# Patient Record
Sex: Female | Born: 1970
Health system: Southern US, Community
[De-identification: ages and names within clinical notes are randomized; demographics above are authoritative.]

## PROBLEM LIST (undated history)

## (undated) DIAGNOSIS — G5602 Carpal tunnel syndrome, left upper limb: Secondary | ICD-10-CM

## (undated) DIAGNOSIS — J302 Other seasonal allergic rhinitis: Secondary | ICD-10-CM

## (undated) DIAGNOSIS — M26609 Unspecified temporomandibular joint disorder, unspecified side: Secondary | ICD-10-CM

## (undated) HISTORY — PX: OTHER SURGICAL HISTORY: SHX169

---

## 2000-06-29 ENCOUNTER — Other Ambulatory Visit: Admission: RE | Admit: 2000-06-29 | Discharge: 2000-06-29 | Payer: Self-pay | Admitting: Family Medicine

## 2009-03-12 ENCOUNTER — Encounter: Admission: RE | Admit: 2009-03-12 | Discharge: 2009-03-12 | Payer: Self-pay | Admitting: Obstetrics and Gynecology

## 2009-05-04 ENCOUNTER — Ambulatory Visit (HOSPITAL_COMMUNITY): Admission: RE | Admit: 2009-05-04 | Discharge: 2009-05-04 | Payer: Self-pay | Admitting: Obstetrics and Gynecology

## 2009-05-19 ENCOUNTER — Ambulatory Visit (HOSPITAL_COMMUNITY): Admission: RE | Admit: 2009-05-19 | Discharge: 2009-05-19 | Payer: Self-pay | Admitting: Obstetrics and Gynecology

## 2009-06-26 ENCOUNTER — Ambulatory Visit: Payer: Self-pay | Admitting: Obstetrics and Gynecology

## 2009-06-26 ENCOUNTER — Inpatient Hospital Stay (HOSPITAL_COMMUNITY): Admission: AD | Admit: 2009-06-26 | Discharge: 2009-06-27 | Payer: Self-pay | Admitting: Obstetrics and Gynecology

## 2009-06-27 ENCOUNTER — Ambulatory Visit: Payer: Self-pay | Admitting: Vascular Surgery

## 2009-06-27 ENCOUNTER — Ambulatory Visit (HOSPITAL_COMMUNITY): Admission: RE | Admit: 2009-06-27 | Discharge: 2009-06-27 | Payer: Self-pay | Admitting: Obstetrics & Gynecology

## 2009-06-27 ENCOUNTER — Encounter (INDEPENDENT_AMBULATORY_CARE_PROVIDER_SITE_OTHER): Payer: Self-pay | Admitting: Obstetrics & Gynecology

## 2009-06-29 ENCOUNTER — Ambulatory Visit: Payer: Self-pay | Admitting: Oncology

## 2009-07-01 LAB — CMP (CANCER CENTER ONLY)
ALT(SGPT): 32 U/L (ref 10–47)
AST: 32 U/L (ref 11–38)
Albumin: 2.9 g/dL — ABNORMAL LOW (ref 3.3–5.5)
Alkaline Phosphatase: 54 U/L (ref 26–84)
BUN, Bld: 7 mg/dL (ref 7–22)
CO2: 22 mEq/L (ref 18–33)
Chloride: 101 mEq/L (ref 98–108)
Creat: 0.5 mg/dl — ABNORMAL LOW (ref 0.6–1.2)
Glucose, Bld: 117 mg/dL (ref 73–118)
Potassium: 3.7 mEq/L (ref 3.3–4.7)
Total Bilirubin: 0.6 mg/dl (ref 0.20–1.60)

## 2009-07-01 LAB — CBC WITH DIFFERENTIAL (CANCER CENTER ONLY)
BASO#: 0.1 10*3/uL (ref 0.0–0.2)
BASO%: 0.7 % (ref 0.0–2.0)
MCHC: 34.2 g/dL (ref 32.0–36.0)
WBC: 9.3 10*3/uL (ref 3.9–10.0)

## 2009-07-06 LAB — HYPERCOAGULABLE PANEL, COMPREHENSIVE
Anticardiolipin IgM: 2 MPL U/mL (ref <11)
Beta-2-Glycoprotein I IgA: 2 A Units (ref <20)
Beta-2-Glycoprotein I IgM: 3 M Units (ref <20)
DRVVT: 38 secs (ref 36.2–44.3)
PTT Lupus Anticoagulant: 40.3 secs (ref 30.0–45.6)
Protein C Activity: 173 % — ABNORMAL HIGH (ref 75–133)

## 2009-07-27 ENCOUNTER — Ambulatory Visit: Payer: Self-pay | Admitting: Oncology

## 2009-07-29 LAB — BASIC METABOLIC PANEL - CANCER CENTER ONLY
BUN, Bld: 6 mg/dL — ABNORMAL LOW (ref 7–22)
Chloride: 102 mEq/L (ref 98–108)
Glucose, Bld: 106 mg/dL (ref 73–118)
Potassium: 3.8 mEq/L (ref 3.3–4.7)
Sodium: 132 mEq/L (ref 128–145)

## 2009-07-29 LAB — CBC WITH DIFFERENTIAL/PLATELET
BASO%: 0.3 % (ref 0.0–2.0)
Basophils Absolute: 0 10*3/uL (ref 0.0–0.1)
EOS%: 0.3 % (ref 0.0–7.0)
Eosinophils Absolute: 0 10*3/uL (ref 0.0–0.5)
HCT: 35.7 % (ref 34.8–46.6)
HGB: 12.4 g/dL (ref 11.6–15.9)
LYMPH%: 19.2 % (ref 14.0–49.7)
MCH: 33.8 pg (ref 25.1–34.0)
MCHC: 34.8 g/dL (ref 31.5–36.0)
MONO#: 0.5 10*3/uL (ref 0.1–0.9)
MONO%: 4.4 % (ref 0.0–14.0)
NEUT#: 7.8 10*3/uL — ABNORMAL HIGH (ref 1.5–6.5)
NEUT%: 75.8 % (ref 38.4–76.8)
RDW: 12.7 % (ref 11.2–14.5)

## 2009-07-30 LAB — HEPARIN ANTI-XA: Heparin LMW: 0.34 IU/mL

## 2009-09-02 ENCOUNTER — Ambulatory Visit: Payer: Self-pay | Admitting: Oncology

## 2009-09-08 LAB — CBC WITH DIFFERENTIAL (CANCER CENTER ONLY)
EOS%: 1 % (ref 0.0–7.0)
Eosinophils Absolute: 0.1 10*3/uL (ref 0.0–0.5)
HGB: 12.8 g/dL (ref 11.6–15.9)
LYMPH#: 1.4 10*3/uL (ref 0.9–3.3)
MCV: 95 fL (ref 81–101)
MONO#: 0.5 10*3/uL (ref 0.1–0.9)
NEUT%: 76.6 % (ref 39.6–80.0)
RBC: 3.97 10*6/uL (ref 3.70–5.32)
WBC: 8.8 10*3/uL (ref 3.9–10.0)

## 2009-09-15 ENCOUNTER — Inpatient Hospital Stay (HOSPITAL_COMMUNITY): Admission: AD | Admit: 2009-09-15 | Discharge: 2009-09-16 | Payer: Self-pay | Admitting: Obstetrics and Gynecology

## 2009-09-15 ENCOUNTER — Ambulatory Visit: Payer: Self-pay | Admitting: Obstetrics and Gynecology

## 2009-09-16 ENCOUNTER — Ambulatory Visit (HOSPITAL_COMMUNITY): Admission: RE | Admit: 2009-09-16 | Discharge: 2009-09-16 | Payer: Self-pay | Admitting: Obstetrics and Gynecology

## 2009-09-23 LAB — URINALYSIS, MICROSCOPIC (CHCC SATELLITE)
Bilirubin (Urine): NEGATIVE
Glucose: NEGATIVE g/dL
Ketones: NEGATIVE mg/dL
Nitrite: NEGATIVE

## 2009-10-01 ENCOUNTER — Inpatient Hospital Stay (HOSPITAL_COMMUNITY): Admission: RE | Admit: 2009-10-01 | Discharge: 2009-10-03 | Payer: Self-pay | Admitting: Obstetrics and Gynecology

## 2009-10-12 ENCOUNTER — Ambulatory Visit: Payer: Self-pay | Admitting: Oncology

## 2009-10-14 LAB — HEPARIN ANTI-XA: Heparin LMW: 1.1 IU/mL

## 2009-10-14 LAB — PROTIME-INR (CHCC SATELLITE)
INR: 1 — ABNORMAL LOW (ref 2.0–3.5)
Protime: 12 Seconds (ref 10.6–13.4)

## 2009-10-14 LAB — CBC WITH DIFFERENTIAL (CANCER CENTER ONLY)
BASO#: 0.1 10*3/uL (ref 0.0–0.2)
BASO%: 0.7 % (ref 0.0–2.0)
HCT: 40.6 % (ref 34.8–46.6)
HGB: 13.9 g/dL (ref 11.6–15.9)
MCH: 32.1 pg (ref 26.0–34.0)
MCHC: 34.2 g/dL (ref 32.0–36.0)
NEUT#: 3.3 10*3/uL (ref 1.5–6.5)
RDW: 11.8 % (ref 10.5–14.6)

## 2009-10-26 LAB — PROTIME-INR (CHCC SATELLITE): Protime: 25.2 Seconds — ABNORMAL HIGH (ref 10.6–13.4)

## 2009-11-02 LAB — PROTIME-INR (CHCC SATELLITE)

## 2009-11-05 ENCOUNTER — Ambulatory Visit: Payer: Self-pay | Admitting: Oncology

## 2009-11-11 LAB — PROTIME-INR (CHCC SATELLITE)

## 2009-11-18 LAB — PROTIME-INR (CHCC SATELLITE): Protime: 21.6 Seconds — ABNORMAL HIGH (ref 10.6–13.4)

## 2009-11-25 LAB — PROTIME-INR (CHCC SATELLITE): INR: 2.2 (ref 2.0–3.5)

## 2009-12-07 ENCOUNTER — Ambulatory Visit: Payer: Self-pay | Admitting: Oncology

## 2010-02-03 ENCOUNTER — Ambulatory Visit (HOSPITAL_BASED_OUTPATIENT_CLINIC_OR_DEPARTMENT_OTHER): Payer: BC Managed Care – PPO | Admitting: Oncology

## 2010-02-10 ENCOUNTER — Encounter (HOSPITAL_BASED_OUTPATIENT_CLINIC_OR_DEPARTMENT_OTHER): Payer: BC Managed Care – PPO | Admitting: Oncology

## 2010-02-10 DIAGNOSIS — Z86718 Personal history of other venous thrombosis and embolism: Secondary | ICD-10-CM

## 2010-02-10 DIAGNOSIS — D6859 Other primary thrombophilia: Secondary | ICD-10-CM

## 2010-02-10 DIAGNOSIS — R319 Hematuria, unspecified: Secondary | ICD-10-CM

## 2010-02-10 DIAGNOSIS — I82409 Acute embolism and thrombosis of unspecified deep veins of unspecified lower extremity: Secondary | ICD-10-CM

## 2010-02-10 DIAGNOSIS — Z7901 Long term (current) use of anticoagulants: Secondary | ICD-10-CM

## 2010-02-10 LAB — CBC & DIFF AND RETIC
Eosinophils Absolute: 0 10*3/uL (ref 0.0–0.5)
HCT: 41.7 % (ref 34.8–46.6)
HGB: 14.1 g/dL (ref 11.6–15.9)
Immature Retic Fract: 3.3 % (ref 0.00–10.70)
MCV: 93.9 fL (ref 79.5–101.0)
MONO%: 3.3 % (ref 0.0–14.0)
NEUT#: 3.8 10*3/uL (ref 1.5–6.5)
NEUT%: 59.6 % (ref 38.4–76.8)
RDW: 12.7 % (ref 11.2–14.5)
lymph#: 2.3 10*3/uL (ref 0.9–3.3)

## 2010-02-10 LAB — PROTIME-INR

## 2010-02-14 LAB — FOLATE: Folate: 16.4 ng/mL

## 2010-02-14 LAB — PROTEIN ELECTROPHORESIS, SERUM
Alpha-1-Globulin: 4 % (ref 2.9–4.9)
Alpha-2-Globulin: 11.3 % (ref 7.1–11.8)
Beta 2: 5.2 % (ref 3.2–6.5)
Beta Globulin: 6.9 % (ref 4.7–7.2)
Gamma Globulin: 13.7 % (ref 11.1–18.8)
Total Protein, Serum Electrophoresis: 6.7 g/dL (ref 6.0–8.3)

## 2010-02-14 LAB — IRON AND TIBC: UIBC: 299 ug/dL

## 2010-02-24 ENCOUNTER — Emergency Department: Payer: Self-pay | Admitting: Emergency Medicine

## 2010-03-01 ENCOUNTER — Ambulatory Visit: Payer: Self-pay | Admitting: Internal Medicine

## 2010-03-24 LAB — RPR: RPR Ser Ql: NONREACTIVE

## 2010-03-24 LAB — CBC
HCT: 33.2 % — ABNORMAL LOW (ref 36.0–46.0)
HCT: 38.7 % (ref 36.0–46.0)
HCT: 35.5 % — ABNORMAL LOW (ref 36.0–46.0)
MCHC: 34.3 g/dL (ref 30.0–36.0)
MCHC: 35.2 g/dL (ref 30.0–36.0)
MCV: 97.4 fL (ref 78.0–100.0)
MCV: 98.4 fL (ref 78.0–100.0)
Platelets: 308 10*3/uL (ref 150–400)
RBC: 3.97 MIL/uL (ref 3.87–5.11)
RDW: 13.9 % (ref 11.5–15.5)
RDW: 14 % (ref 11.5–15.5)

## 2010-03-24 LAB — SURGICAL PCR SCREEN
MRSA, PCR: NEGATIVE
Staphylococcus aureus: NEGATIVE

## 2011-04-24 ENCOUNTER — Other Ambulatory Visit (HOSPITAL_COMMUNITY): Payer: Self-pay | Admitting: Obstetrics & Gynecology

## 2011-04-24 DIAGNOSIS — K8020 Calculus of gallbladder without cholecystitis without obstruction: Secondary | ICD-10-CM

## 2011-04-25 ENCOUNTER — Ambulatory Visit (HOSPITAL_COMMUNITY)
Admission: RE | Admit: 2011-04-25 | Discharge: 2011-04-25 | Disposition: A | Discharge status: Home / Self Care | Payer: BC Managed Care – PPO | Source: Ambulatory Visit | Attending: Obstetrics & Gynecology | Admitting: Obstetrics & Gynecology

## 2011-04-25 DIAGNOSIS — K8020 Calculus of gallbladder without cholecystitis without obstruction: Secondary | ICD-10-CM

## 2011-04-25 DIAGNOSIS — R112 Nausea with vomiting, unspecified: Secondary | ICD-10-CM | POA: Insufficient documentation

## 2011-04-25 DIAGNOSIS — R109 Unspecified abdominal pain: Secondary | ICD-10-CM | POA: Insufficient documentation

## 2013-09-01 ENCOUNTER — Ambulatory Visit: Payer: Self-pay | Admitting: Physician Assistant

## 2013-09-01 LAB — URINALYSIS, COMPLETE
BACTERIA: NEGATIVE
Blood: NEGATIVE
Glucose,UR: NEGATIVE
KETONE: NEGATIVE
Leukocyte Esterase: NEGATIVE
Nitrite: NEGATIVE
Ph: 8 (ref 5.0–8.0)
Protein: NEGATIVE
RBC, UR: NONE SEEN /HPF (ref 0–5)
SPECIFIC GRAVITY: 1.01 (ref 1.000–1.030)
WBC UR: NONE SEEN /HPF (ref 0–5)

## 2013-09-17 ENCOUNTER — Ambulatory Visit: Payer: Self-pay | Admitting: Internal Medicine

## 2013-09-17 LAB — RAPID INFLUENZA A&B ANTIGENS

## 2013-09-17 LAB — RAPID STREP-A WITH REFLX: MICRO TEXT REPORT: NEGATIVE

## 2013-09-20 LAB — BETA STREP CULTURE(ARMC)

## 2014-05-30 ENCOUNTER — Emergency Department (INDEPENDENT_AMBULATORY_CARE_PROVIDER_SITE_OTHER)
Admission: EM | Admit: 2014-05-30 | Discharge: 2014-05-30 | Disposition: A | Discharge status: Home / Self Care | Payer: 59 | Source: Home / Self Care | Attending: Family Medicine | Admitting: Family Medicine

## 2014-05-30 ENCOUNTER — Encounter (HOSPITAL_COMMUNITY): Payer: Self-pay | Admitting: Emergency Medicine

## 2014-05-30 DIAGNOSIS — R079 Chest pain, unspecified: Secondary | ICD-10-CM | POA: Diagnosis not present

## 2014-05-30 NOTE — ED Notes (Signed)
C/o chest pain and headache x 1 month.  Pt triaged and assessed by provider.  Provider in before nurse.

## 2014-05-30 NOTE — ED Provider Notes (Signed)
CSN: [id number]     Arrival date & time 05/30/14  1750 History   First MD Initiated Contact with Patient 05/30/14 1906     Chief Complaint  Patient presents with  . Chest Pain  . Headache   (Consider location/radiation/quality/duration/timing/severity/associated sxs/prior Treatment) HPI Comments: 44 year old female presents with a chief complaint of mild chest pain for 4 and half weeks. It is been getting worse in the past 5-6 days. It is located to the left anterior chest, greatest along the left parasternal border. Denies heaviness tightness fullness or pressure. She states that sometimes she feels a pain or squeezing pain to the bilateral aspects of the chest. Sometimes it makes her feel as though she is about to pass out and with 2 episodes of pain she has vomited twice. Denies diaphoresis. Denies cardiac history. She is a nonsmoker.   History reviewed. No pertinent past medical history. Past Surgical History  Procedure Laterality Date  . Cecerian section      x 2   History reviewed. No pertinent family history. History  Substance Use Topics  . Smoking status: Never Smoker   . Smokeless tobacco: Not on file  . Alcohol Use: No   OB History    No data available     Review of Systems  Constitutional: Negative for fever, activity change and fatigue.  HENT: Positive for postnasal drip.   Respiratory: Negative for chest tightness and wheezing.   Cardiovascular: Positive for chest pain. Negative for palpitations and leg swelling.  Gastrointestinal: Positive for vomiting. Negative for abdominal pain and abdominal distention.  Genitourinary: Negative.   Neurological: Positive for light-headedness. Negative for tremors and speech difficulty.    Allergies  Diflucan and Keflex  Home Medications   Prior to Admission medications   Medication Sig Start Date End Date Taking? Authorizing Provider  Amphetamine-Dextroamphetamine (ADDERALL PO) Take by mouth.   Yes Historical Provider,  MD   BP 126/74 mmHg  Pulse 76  Temp(Src) 98.2 F (36.8 C) (Oral)  Resp 18  SpO2 100%  LMP 03/30/2014 Physical Exam  Constitutional: She appears well-developed and well-nourished. No distress.  Eyes: Conjunctivae and EOM are normal.  Neck: Normal range of motion. Neck supple.  Cardiovascular: Normal rate, regular rhythm, normal heart sounds and intact distal pulses.   Pulmonary/Chest: Effort normal and breath sounds normal. No respiratory distress. She has no wheezes. She has no rales.  Minor tenderness to the upper sternal border near the manubrium. Minor tenderness to the left lateral ribs.  Abdominal: Soft. There is no tenderness. There is no rebound.  Musculoskeletal: Normal range of motion. She exhibits no edema or tenderness.  Neurological: She is alert. She exhibits normal muscle tone.  Skin: Skin is warm and dry. No rash noted.  Psychiatric: She has a normal mood and affect.  Nursing note and vitals reviewed.   ED Course  Procedures (including critical care time) Labs Review Labs Reviewed - No data to display  Imaging Review No results found. ED ECG REPORT   Date: 05/30/2014  Rate: 66  Rhythm: NSR  QRS Axis:   Intervals: PR 174,  QTc 404  ST/T Wave abnormalities: early repol  Conduction Disutrbances:no ecotpy  Narrative Interpretation:   Old EKG Reviewed:   I have personally reviewed the EKG tracing and agree with the computerized printout as noted.   MDM   1. Chest pain, unspecified chest pain type     Patient is stable, laughing, jovial and in no acute distress. Due to the associated  symptoms of feeling like she wanted to pass out whenever she gets chest pain and having 2 episodes of vomiting associated with chest pain recommend that she be evaluated in the emergency department for chest pain. She may go by shuttle. She did note that she may not be able to go this evening due to childcare problems. She realizes that she needs to have this workup and the  consequences of having a heart problem and not having it evaluated. If she does not go to the emergency department now will have her sign an AMA.    Hayden Rasmussen, NP 05/30/14 2010

## 2014-05-30 NOTE — Discharge Instructions (Signed)
Chest Pain Observation It is often hard to give a specific diagnosis for the cause of chest pain. Among other possibilities your symptoms might be caused by inadequate oxygen delivery to your heart (angina). Angina that is not treated or evaluated can lead to a heart attack (myocardial infarction) or death. Blood tests, electrocardiograms, and X-rays may have been done to help determine a possible cause of your chest pain. After evaluation and observation, your health care provider has determined that it is unlikely your pain was caused by an unstable condition that requires hospitalization. However, a full evaluation of your pain may need to be completed, with additional diagnostic testing as directed. It is very important to keep your follow-up appointments. Not keeping your follow-up appointments could result in permanent heart damage, disability, or death. If there is any problem keeping your follow-up appointments, you must call your health care provider. HOME CARE INSTRUCTIONS  Due to the slight chance that your pain could be angina, it is important to follow your health care provider's treatment plan and also maintain a healthy lifestyle:  Maintain or work toward achieving a healthy weight.  Stay physically active and exercise regularly.  Decrease your salt intake.  Eat a balanced, healthy diet. Talk to a dietitian to learn about heart-healthy foods.  Increase your fiber intake by including whole grains, vegetables, fruits, and nuts in your diet.  Avoid situations that cause stress, anger, or depression.  Take medicines as advised by your health care provider. Report any side effects to your health care provider. Do not stop medicines or adjust the dosages on your own.  Quit smoking. Do not use nicotine patches or gum until you check with your health care provider.  Keep your blood pressure, blood sugar, and cholesterol levels within normal limits.  Limit alcohol intake to no more than  1 drink per day for women who are not pregnant and 2 drinks per day for men.  Do not abuse drugs. SEEK IMMEDIATE MEDICAL CARE IF: You have severe chest pain or pressure which may include symptoms such as:  You feel pain or pressure in your arms, neck, jaw, or back.  You have severe back or abdominal pain, feel sick to your stomach (nauseous), or throw up (vomit).  You are sweating profusely.  You are having a fast or irregular heartbeat.  You feel short of breath while at rest.  You notice increasing shortness of breath during rest, sleep, or with activity.  You have chest pain that does not get better after rest or after taking your usual medicine.  You wake from sleep with chest pain.  You are unable to sleep because you cannot breathe.  You develop a frequent cough or you are coughing up blood.  You feel dizzy, faint, or experience extreme fatigue.  You develop severe weakness, dizziness, fainting, or chills. Any of these symptoms may represent a serious problem that is an emergency. Do not wait to see if the symptoms will go away. Call your local emergency services (911 in the [place] Do not drive yourself to the hospital. MAKE SURE YOU:  Understand these instructions.  Will watch your condition.  Will get help right away if you are not doing well or get worse. Document Released: 01/28/2010 Document Revised: 12/31/2012 Document Reviewed: 06/27/2012 [facility] Patient Information 2015 [place], [place]. This information is not intended to replace advice given to you by your health care provider. Make sure you discuss any questions you have with your health care provider.  Chest Pain (Nonspecific) It is often hard to give a diagnosis for the cause of chest pain. There is always a chance that your pain could be related to something serious, such as a heart attack or a blood clot in the lungs. You need to follow up with your doctor. HOME CARE  If antibiotic medicine was given,  take it as directed by your doctor. Finish the medicine even if you start to feel better.  For the next few days, avoid activities that bring on chest pain. Continue physical activities as told by your doctor.  Do not use any tobacco products. This includes cigarettes, chewing tobacco, and e-cigarettes.  Avoid drinking alcohol.  Only take medicine as told by your doctor.  Follow your doctor's suggestions for more testing if your chest pain does not go away.  Keep all doctor visits you made. GET HELP IF:  Your chest pain does not go away, even after treatment.  You have a rash with blisters on your chest.  You have a fever. GET HELP RIGHT AWAY IF:   You have more pain or pain that spreads to your arm, neck, jaw, back, or belly (abdomen).  You have shortness of breath.  You cough more than usual or cough up blood.  You have very bad back or belly pain.  You feel sick to your stomach (nauseous) or throw up (vomit).  You have very bad weakness.  You pass out (faint).  You have chills. This is an emergency. Do not wait to see if the problems will go away. Call your local emergency services (911 in [place] Do not drive yourself to the hospital. MAKE SURE YOU:   Understand these instructions.  Will watch your condition.  Will get help right away if you are not doing well or get worse. Document Released: 06/14/2007 Document Revised: 12/31/2012 Document Reviewed: 06/14/2007 [facility] Patient Information 2015 [place]. This information is not intended to replace advice given to you by your health care provider. Make sure you discuss any questions you have with your health care provider.

## 2014-08-18 ENCOUNTER — Ambulatory Visit
Admission: RE | Admit: 2014-08-18 | Discharge: 2014-08-18 | Disposition: A | Discharge status: Home / Self Care | Payer: 59 | Source: Ambulatory Visit | Attending: Physician Assistant | Admitting: Physician Assistant

## 2014-08-18 ENCOUNTER — Other Ambulatory Visit: Payer: Self-pay | Admitting: Physician Assistant

## 2014-08-18 DIAGNOSIS — R52 Pain, unspecified: Secondary | ICD-10-CM

## 2014-11-18 ENCOUNTER — Encounter: Payer: Self-pay | Admitting: Emergency Medicine

## 2014-11-18 ENCOUNTER — Ambulatory Visit
Admission: EM | Admit: 2014-11-18 | Discharge: 2014-11-18 | Disposition: A | Discharge status: Home / Self Care | Payer: 59 | Attending: Family Medicine | Admitting: Family Medicine

## 2014-11-18 DIAGNOSIS — J111 Influenza due to unidentified influenza virus with other respiratory manifestations: Secondary | ICD-10-CM | POA: Diagnosis not present

## 2014-11-18 HISTORY — DX: Other seasonal allergic rhinitis: J30.2

## 2014-11-18 HISTORY — DX: Unspecified temporomandibular joint disorder, unspecified side: M26.609

## 2014-11-18 LAB — RAPID INFLUENZA A&B ANTIGENS
Influenza A (ARMC): NOT DETECTED
Influenza B (ARMC): NOT DETECTED

## 2014-11-18 MED ORDER — HYDROCOD POLST-CPM POLST ER 10-8 MG/5ML PO SUER: 5.0000 mL | Freq: Two times a day (BID) | ORAL | Status: DC

## 2014-11-18 MED ORDER — OSELTAMIVIR PHOSPHATE 75 MG PO CAPS: 75.0000 mg | ORAL_CAPSULE | Freq: Two times a day (BID) | ORAL | Status: DC

## 2014-11-18 NOTE — Discharge Instructions (Signed)

## 2014-11-18 NOTE — ED Notes (Signed)
Patient states she started aching on Sunday, she has developed a cough, sinus drainage, sore throat, feels terrible.

## 2014-11-18 NOTE — ED Provider Notes (Signed)
CSN: [id number]     Arrival date & time 11/18/14  1024 History   First MD Initiated Contact with Patient 11/18/14 1200     Chief Complaint  Patient presents with  . Influenza   (Consider location/radiation/quality/duration/timing/severity/associated sxs/prior Treatment) HPI   This a 44 year old female who states that on Monday she began to have a cough and sinus drainage sore throat and severe body aches. Since then she states that even her hair hurts. Been having chills but did not any fever and today she is afebrile and 98.6. Pulse 89 respirations 18 and O2 sats 100. She looks as if she does not feel well. She works from home.  Past Medical History  Diagnosis Date  . Seasonal allergies   . TMJ disease    Past Surgical History  Procedure Laterality Date  . Cecerian section      x 2   History reviewed. No pertinent family history. Social History  Substance Use Topics  . Smoking status: Never Smoker   . Smokeless tobacco: None  . Alcohol Use: No   OB History    No data available     Review of Systems  Constitutional: Positive for chills, activity change and fatigue. Negative for fever and diaphoresis.  HENT: Positive for congestion, ear pain, postnasal drip, rhinorrhea, sinus pressure, sneezing and sore throat.   Respiratory: Positive for cough.   Musculoskeletal: Positive for myalgias and arthralgias.  All other systems reviewed and are negative.   Allergies  Diflucan and Keflex  Home Medications   Prior to Admission medications   Medication Sig Start Date End Date Taking? Authorizing Provider  Amphetamine-Dextroamphetamine (ADDERALL PO) Take by mouth.    Historical Provider, MD  chlorpheniramine-HYDROcodone (TUSSIONEX PENNKINETIC ER) 10-8 MG/5ML SUER Take 5 mLs by mouth 2 (two) times daily. 11/18/14   Lutricia Feil, PA-C  oseltamivir (TAMIFLU) 75 MG capsule Take 1 capsule (75 mg total) by mouth every 12 (twelve) hours. 11/18/14   Lutricia Feil, PA-C   Meds  Ordered and Administered this Visit  Medications - No data to display  BP 122/81 mmHg  Pulse 89  Temp(Src) 98.6 F (37 C) (Tympanic)  Resp 18  Ht  (1.651 m)  Wt 189 lb (85.73 kg)  BMI 31.45 kg/m2  SpO2 100%  LMP 11/04/2014 (Approximate) No data found.   Physical Exam  Constitutional: She is oriented to person, place, and time. She appears well-developed and well-nourished. No distress.  HENT:  Head: Normocephalic and atraumatic.  Right TM is injected with a air-fluid level present. Left TM is normal. Is no cervical adenopathy appreciated. Oropharynx is benign.  Eyes: Pupils are equal, round, and reactive to light. Right eye exhibits no discharge. Left eye exhibits no discharge.  Neck: Neck supple.  Pulmonary/Chest: Effort normal and breath sounds normal. No respiratory distress. She has no wheezes. She has no rales.  Musculoskeletal: Normal range of motion. She exhibits no edema or tenderness.  Lymphadenopathy:    She has no cervical adenopathy.  Neurological: She is alert and oriented to person, place, and time.  Skin: Skin is warm and dry. She is not diaphoretic.  Psychiatric: She has a normal mood and affect. Her behavior is normal. Judgment and thought content normal.  Nursing note and vitals reviewed.   ED Course  Procedures (including critical care time)  Labs Review Labs Reviewed  RAPID INFLUENZA A&B ANTIGENS [facility] ONLY)    Imaging Review No results found.   Visual Acuity Review  Right Eye  Distance:   Left Eye Distance:   Bilateral Distance:    Right Eye Near:   Left Eye Near:    Bilateral Near:         MDM   1. Influenza    New Prescriptions   CHLORPHENIRAMINE-HYDROCODONE (TUSSIONEX PENNKINETIC ER) 10-8 MG/5ML SUER    Take 5 mLs by mouth 2 (two) times daily.   OSELTAMIVIR (TAMIFLU) 75 MG CAPSULE    Take 1 capsule (75 mg total) by mouth every 12 (twelve) hours.  Plan: 1. Test/x-ray results and diagnosis reviewed with patient 2. rx as per  orders; risks, benefits, potential side effects reviewed with patient. Use flonase for 1 month. IBU for pain. 3. Recommend supportive treatment with rest fluids. OOW til Monday 11/23/2014. 4. F/u  PCP if not improving     Lutricia FeilWilliam P Roemer, PA-C 11/18/14 1343

## 2014-11-24 ENCOUNTER — Other Ambulatory Visit: Payer: Self-pay | Admitting: Physician Assistant

## 2014-11-24 ENCOUNTER — Ambulatory Visit
Admission: RE | Admit: 2014-11-24 | Discharge: 2014-11-24 | Disposition: A | Discharge status: Home / Self Care | Payer: 59 | Source: Ambulatory Visit | Attending: Physician Assistant | Admitting: Physician Assistant

## 2014-11-24 DIAGNOSIS — R059 Cough, unspecified: Secondary | ICD-10-CM

## 2014-11-24 DIAGNOSIS — R05 Cough: Secondary | ICD-10-CM

## 2014-11-24 DIAGNOSIS — R0602 Shortness of breath: Secondary | ICD-10-CM

## 2014-11-24 DIAGNOSIS — R509 Fever, unspecified: Secondary | ICD-10-CM

## 2014-12-16 ENCOUNTER — Other Ambulatory Visit: Payer: Self-pay | Admitting: Physician Assistant

## 2014-12-16 ENCOUNTER — Other Ambulatory Visit (HOSPITAL_COMMUNITY)
Admission: RE | Admit: 2014-12-16 | Discharge: 2014-12-16 | Disposition: A | Discharge status: Home / Self Care | Payer: 59 | Source: Ambulatory Visit | Attending: Family Medicine | Admitting: Family Medicine

## 2014-12-16 DIAGNOSIS — Z124 Encounter for screening for malignant neoplasm of cervix: Secondary | ICD-10-CM | POA: Diagnosis present

## 2014-12-18 LAB — CYTOLOGY - PAP

## 2015-06-17 ENCOUNTER — Encounter (HOSPITAL_COMMUNITY): Payer: Self-pay | Admitting: Emergency Medicine

## 2015-06-17 ENCOUNTER — Ambulatory Visit (HOSPITAL_COMMUNITY)
Admission: EM | Admit: 2015-06-17 | Discharge: 2015-06-17 | Disposition: A | Discharge status: Home / Self Care | Payer: 59 | Attending: Emergency Medicine | Admitting: Emergency Medicine

## 2015-06-17 DIAGNOSIS — J069 Acute upper respiratory infection, unspecified: Secondary | ICD-10-CM | POA: Diagnosis not present

## 2015-06-17 MED ORDER — AMOXICILLIN 500 MG PO CAPS: 500.0000 mg | ORAL_CAPSULE | Freq: Three times a day (TID) | ORAL | Status: DC

## 2015-06-17 MED ORDER — ONDANSETRON HCL 4 MG/2ML IJ SOLN
4.0000 mg | Freq: Once | INTRAMUSCULAR | Status: AC
  Administered 2015-06-17: 4 mg via INTRAMUSCULAR

## 2015-06-17 MED ORDER — LIDOCAINE HCL (PF) 1 % IJ SOLN
INTRAMUSCULAR | Status: AC
  Filled 2015-06-17: qty 5

## 2015-06-17 MED ORDER — ONDANSETRON HCL 4 MG/2ML IJ SOLN
INTRAMUSCULAR | Status: AC
  Filled 2015-06-17: qty 2

## 2015-06-17 MED ORDER — ONDANSETRON HCL 4 MG PO TABS: 4.0000 mg | ORAL_TABLET | Freq: Four times a day (QID) | ORAL | Status: DC

## 2015-06-17 MED ORDER — KETOROLAC TROMETHAMINE 30 MG/ML IJ SOLN
INTRAMUSCULAR | Status: AC
  Filled 2015-06-17: qty 1

## 2015-06-17 MED ORDER — KETOROLAC TROMETHAMINE 30 MG/ML IJ SOLN
30.0000 mg | Freq: Once | INTRAMUSCULAR | Status: AC
  Administered 2015-06-17: 30 mg via INTRAMUSCULAR

## 2015-06-17 MED ORDER — CEFTRIAXONE SODIUM 1 G IJ SOLR
1.0000 g | Freq: Once | INTRAMUSCULAR | Status: AC
  Administered 2015-06-17: 1 g via INTRAMUSCULAR

## 2015-06-17 MED ORDER — CEFTRIAXONE SODIUM 1 G IJ SOLR
INTRAMUSCULAR | Status: AC
  Filled 2015-06-17: qty 10

## 2015-06-17 NOTE — ED Notes (Signed)
Patient denies any reaction to medication.  

## 2015-06-17 NOTE — Discharge Instructions (Signed)
Upper Respiratory Infection, Adult Most upper respiratory infections (URIs) are a viral infection of the air passages leading to the lungs. A URI affects the nose, throat, and upper air passages. The most common type of URI is nasopharyngitis and is typically referred to as "the common cold." URIs run their course and usually go away on their own. Most of the time, a URI does not require medical attention, but sometimes a bacterial infection in the upper airways can follow a viral infection. This is called a secondary infection. Sinus and middle ear infections are common types of secondary upper respiratory infections. Bacterial pneumonia can also complicate a URI. A URI can worsen asthma and chronic obstructive pulmonary disease (COPD). Sometimes, these complications can require emergency medical care and may be life threatening.  CAUSES Almost all URIs are caused by viruses. A virus is a type of germ and can spread from one person to another.  RISKS FACTORS You may be at risk for a URI if:   You smoke.   You have chronic heart or lung disease.  You have a weakened defense (immune) system.   You are very young or very old.   You have nasal allergies or asthma.  You work in crowded or poorly ventilated areas.  You work in health care facilities or schools. SIGNS AND SYMPTOMS  Symptoms typically develop 2-3 days after you come in contact with a cold virus. Most viral URIs last 7-10 days. However, viral URIs from the influenza virus (flu virus) can last 14-18 days and are typically more severe. Symptoms may include:   Runny or stuffy (congested) nose.   Sneezing.   Cough.   Sore throat.   Headache.   Fatigue.   Fever.   Loss of appetite.   Pain in your forehead, behind your eyes, and over your cheekbones (sinus pain).  Muscle aches.  DIAGNOSIS  Your health care provider may diagnose a URI by:  Physical exam.  Tests to check that your symptoms are not due to  another condition such as:  Strep throat.  Sinusitis.  Pneumonia.  Asthma. TREATMENT  A URI goes away on its own with time. It cannot be cured with medicines, but medicines may be prescribed or recommended to relieve symptoms. Medicines may help:  Reduce your fever.  Reduce your cough.  Relieve nasal congestion. HOME CARE INSTRUCTIONS   Take medicines only as directed by your health care provider.   Gargle warm saltwater or take cough drops to comfort your throat as directed by your health care provider.  Use a warm mist humidifier or inhale steam from a shower to increase air moisture. This may make it easier to breathe.  Drink enough fluid to keep your urine clear or pale yellow.   Eat soups and other clear broths and maintain good nutrition.   Rest as needed.   Return to work when your temperature has returned to normal or as your health care provider advises. You may need to stay home longer to avoid infecting others. You can also use a face mask and careful hand washing to prevent spread of the virus.  Increase the usage of your inhaler if you have asthma.   Do not use any tobacco products, including cigarettes, chewing tobacco, or electronic cigarettes. If you need help quitting, ask your health care provider. PREVENTION  The best way to protect yourself from getting a cold is to practice good hygiene.   Avoid oral or hand contact with people with cold   symptoms.   Wash your hands often if contact occurs.  There is no clear evidence that vitamin C, vitamin E, echinacea, or exercise reduces the chance of developing a cold. However, it is always recommended to get plenty of rest, exercise, and practice good nutrition.  SEEK MEDICAL CARE IF:   You are getting worse rather than better.   Your symptoms are not controlled by medicine.   You have chills.  You have worsening shortness of breath.  You have brown or red mucus.  You have yellow or brown nasal  discharge.  You have pain in your face, especially when you bend forward.  You have a fever.  You have swollen neck glands.  You have pain while swallowing.  You have white areas in the back of your throat. SEEK IMMEDIATE MEDICAL CARE IF:   You have severe or persistent:  Headache.  Ear pain.  Sinus pain.  Chest pain.  You have chronic lung disease and any of the following:  Wheezing.  Prolonged cough.  Coughing up blood.  A change in your usual mucus.  You have a stiff neck.  You have changes in your:  Vision.  Hearing.  Thinking.  Mood. MAKE SURE YOU:   Understand these instructions.  Will watch your condition.  Will get help right away if you are not doing well or get worse.   This information is not intended to replace advice given to you by your health care provider. Make sure you discuss any questions you have with your health care provider.   Document Released: 06/21/2000 Document Revised: 05/12/2014 Document Reviewed: 04/02/2013 Elsevier Interactive Patient Education 2016 [employer]  

## 2015-06-17 NOTE — ED Provider Notes (Signed)
CSN: [id number]     Arrival date & time 06/17/15  1854 History   First MD Initiated Contact with Patient 06/17/15 1916     Chief Complaint  Patient presents with  . URI   (Consider location/radiation/quality/duration/timing/severity/associated sxs/prior Treatment) HPI History obtained from patient:  Pt presents with the cc of:  Headache, nasal congestion, cough, nausea Duration of symptoms: 2-3 days Treatment prior to arrival: Over-the-counter medicines without relief of symptoms Context: Sudden onset of symptoms 2-3 days ago steadily getting worse Other symptoms include: Now nauseated and unable to eat Pain score: 2-3 FAMILY HISTORY: Family history of hypertension    Past Medical History  Diagnosis Date  . Seasonal allergies   . TMJ disease    Past Surgical History  Procedure Laterality Date  . Cecerian section      x 2   History reviewed. No pertinent family history. Social History  Substance Use Topics  . Smoking status: Never Smoker   . Smokeless tobacco: None  . Alcohol Use: No   OB History    No data available     Review of Systems  Denies:   ABDOMINAL PAIN, CHEST PAIN, CONGESTION, DYSURIA, SHORTNESS OF BREATH  Allergies  Diflucan and Keflex  Home Medications   Prior to Admission medications   Medication Sig Start Date End Date Taking? Authorizing Provider  amoxicillin (AMOXIL) 500 MG capsule Take 1 capsule (500 mg total) by mouth 3 (three) times daily. 06/17/15   Tharon Aquas, PA  Amphetamine-Dextroamphetamine (ADDERALL PO) Take by mouth.    Historical Provider, MD  chlorpheniramine-HYDROcodone (TUSSIONEX PENNKINETIC ER) 10-8 MG/5ML SUER Take 5 mLs by mouth 2 (two) times daily. 11/18/14   Lutricia Feil, PA-C  ondansetron (ZOFRAN) 4 MG tablet Take 1 tablet (4 mg total) by mouth every 6 (six) hours. 06/17/15   Tharon Aquas, PA  oseltamivir (TAMIFLU) 75 MG capsule Take 1 capsule (75 mg total) by mouth every 12 (twelve) hours. 11/18/14   Lutricia Feil,  PA-C   Meds Ordered and Administered this Visit   Medications  ondansetron [facility]) injection 4 mg (not administered)  ketorolac (TORADOL) 30 MG/ML injection 30 mg (not administered)  cefTRIAXone (ROCEPHIN) injection 1 g (not administered)    BP 122/83 mmHg  Pulse 90  Temp(Src) 98.5 F (36.9 C) (Oral)  SpO2 98%  LMP 06/03/2015 No data found.   Physical Exam NURSES NOTES AND VITAL SIGNS REVIEWED. CONSTITUTIONAL: Well developed, well nourished, no acute distress HEENT: normocephalic, atraumatic EYES: Conjunctiva normal NECK:normal ROM, supple, no adenopathy PULMONARY:No respiratory distress, normal effort ABDOMINAL: Soft, ND, NT BS+, No CVAT MUSCULOSKELETAL: Normal ROM of all extremities,  SKIN: warm and dry without rash PSYCHIATRIC: Mood and affect, behavior are normal  ED Course  Procedures (including critical care time)  Labs Review Labs Reviewed - No data to display  Imaging Review No results found.   Visual Acuity Review  Right Eye Distance:   Left Eye Distance:   Bilateral Distance:    Right Eye Near:   Left Eye Near:    Bilateral Near:       URI symptoms without fever or sepsis. Expect full recovery.  Prescription for amoxicillin and Zofran Patient requests dose of ceftriaxone although she states that she does have an allergy to Keflex she states that she can take ceftriaxone without any problem.    MDM   1. Acute URI     Patient is reassured that there are no issues that require transfer to higher level  of care at this time or additional tests. Patient is advised to continue home symptomatic treatment. Patient is advised that if there are new or worsening symptoms to attend the emergency department, contact primary care provider, or return to UC. Instructions of care provided discharged home in stable condition.    THIS NOTE WAS GENERATED USING A VOICE RECOGNITION SOFTWARE PROGRAM. ALL REASONABLE EFFORTS  WERE MADE TO PROOFREAD THIS  DOCUMENT FOR ACCURACY.  I have verbally reviewed the discharge instructions with the patient. A printed AVS was given to the patient.  All questions were answered prior to discharge.      Tharon AquasFrank C Beautiful Pensyl, PA 06/17/15 2027

## 2015-06-17 NOTE — ED Notes (Signed)
Patient c/o cold sx including: nasal drainage, productive cough with phlegm, chills, nausea, headaches and abdominal pain x 6 days. Patient has been taking mucinex, delsym, and OTC cold medication with no relief of symptoms. Patient is lying down on triage.

## 2015-09-06 ENCOUNTER — Encounter: Payer: Self-pay | Admitting: *Deleted

## 2015-09-06 ENCOUNTER — Ambulatory Visit
Admission: EM | Admit: 2015-09-06 | Discharge: 2015-09-06 | Disposition: A | Discharge status: Home / Self Care | Payer: 59 | Attending: Family Medicine | Admitting: Family Medicine

## 2015-09-06 DIAGNOSIS — R51 Headache: Secondary | ICD-10-CM | POA: Diagnosis not present

## 2015-09-06 DIAGNOSIS — R519 Headache, unspecified: Secondary | ICD-10-CM

## 2015-09-06 MED ORDER — ONDANSETRON 4 MG PO TBDP
4.0000 mg | ORAL_TABLET | Freq: Once | ORAL | Status: AC
  Administered 2015-09-06: 4 mg via ORAL

## 2015-09-06 MED ORDER — KETOROLAC TROMETHAMINE 60 MG/2ML IM SOLN
60.0000 mg | Freq: Once | INTRAMUSCULAR | Status: AC
  Administered 2015-09-06: 60 mg via INTRAMUSCULAR

## 2015-09-06 NOTE — ED Notes (Signed)
Patient shows no signs of adverse reaction to medication at this time.  

## 2015-09-06 NOTE — ED Provider Notes (Signed)
[facility] ____________________________________________  Time seen: Approximately 3:53 PM  I have reviewed the triage vital signs and the nursing notes.   HISTORY  Chief Complaint Headache  HPI Amber RaseSherry D Kolinski is a 45 y.o. female presents with a complaint of headache. Patient reports headache onset was last night. Patient reports she does have a history of chronic migraines as well as tension headaches. Patient reports in past her migraines have been triggered from various causes including food triggers. Patient reports yesterday during the day she felt fine, went to a baseball game and states that she ate different types of chicken and states that she feels like the preservative in the chicken caused her headache. Patient reports history of similar triggers in the past. Patient reports that her current headache is described as a band that goes all the way around her head and is very tight and throbbing. Moderate pain at this time.Patient reports this is consistent with her past headaches. Denies abrupt onset. Reports headache was gradual onset. Reports headache was unresolved with Excedrin Migraine as well as Aleve.  Patient reports that she does have some light sensitivity and mild nausea accompanying this headache. Denies any dizziness, vision changes, pain radiation, confusion, numbness, tingling sensation, unsteady gait, fall, trauma, weakness, fevers, chest pain, shortness of breath, abdominal pain, dysuria, neck or back pain. Patient again reports this is consistent with her chronic headaches. Patient reports last headache similar was approximately 1 year ago. Patient does also report she is under a lot of stress that she is currently in school and has a lot test coming up. Denies recent sickness, cough, congestion or sore throat.   REDMAN,MICHELLE, MD: PCP Patient's last menstrual period was 08/24/2015 (approximate). Denies chance of pregnancy. Reports not sexually active  at this time.   Past Medical History:  Diagnosis Date  . Seasonal allergies   . TMJ disease   ADHD  There are no active problems to display for this patient.   Past Surgical History:  Procedure Laterality Date  . cecerian section     x 2    No current facility-administered medications for this encounter.   Current Outpatient Prescriptions:  .  Amphetamine-Dextroamphetamine (ADDERALL PO), Take by mouth., Disp: , Rfl:  .  ondansetron (ZOFRAN) 4 MG tablet, Take 1 tablet (4 mg total) by mouth every 6 (six) hours., Disp: 12 tablet, Rfl: 0   Allergies Diflucan [fluconazole] and Keflex [cephalexin]  History reviewed. No pertinent family history.  Social History Social History  Substance Use Topics  . Smoking status: Never Smoker  . Smokeless tobacco: Never Used  . Alcohol use No    Review of Systems Constitutional: No fever/chills Eyes: No visual changes. ENT: No sore throat. Cardiovascular: Denies chest pain. Respiratory: Denies shortness of breath. Gastrointestinal: No abdominal pain.  No nausea, no vomiting.  No diarrhea.  No constipation. Genitourinary: Negative for dysuria. Musculoskeletal: Negative for back pain. Skin: Negative for rash. Neurological: Negative for focal weakness or numbness.  10-point ROS otherwise negative.  ____________________________________________   PHYSICAL EXAM:  VITAL SIGNS: ED Triage Vitals  Enc Vitals Group     BP 09/06/15 1512 107/66     Pulse Rate 09/06/15 1512 76     Resp 09/06/15 1512 16     Temp 09/06/15 1512 98 F (36.7 C)     Temp Source 09/06/15 1512 Oral     SpO2 09/06/15 1512 100 %     Weight 09/06/15 1514 188 lb (85.3 kg)  Height 09/06/15 1514 5\' 6"  (1.676 m)     Head Circumference --      Peak Flow --      Pain Score 09/06/15 1518 6     Pain Loc --      Pain Edu? --      Excl. in GC? --     Constitutional: Alert and oriented. Well appearing and in no acute distress. Eyes: Conjunctivae are normal.  PERRL. EOMI. No pain with EOMs. No papilledema noted. Head: Atraumatic. No tenderness over temporal arteries. No sinus TTP. No tenderness to palpation.   Ears: no erythema, normal TMs bilaterally.   Nose: No congestion/rhinnorhea.  Mouth/Throat: Mucous membranes are moist.  Oropharynx non-erythematous. Neck: No stridor.  No cervical spine tenderness to palpation. No carotid bruits.  Hematological/Lymphatic/Immunilogical: No cervical lymphadenopathy. Cardiovascular: Normal rate, regular rhythm. Grossly normal heart sounds.  Good peripheral circulation. Respiratory: Normal respiratory effort.  No retractions. Lungs CTAB. No wheezes, rales or rhonchi.  Gastrointestinal: Soft and nontender. No distention. Normal Bowel sounds.  No abdominal bruits. No CVA tenderness. Musculoskeletal: No lower or upper extremity tenderness nor edema.  No joint effusions. Bilateral pedal pulses equal and easily palpated. No cervical, thoracic or lumbar TTP.  Neurologic:  Normal speech and language. No gross focal neurologic deficits are appreciated. No gait instability. No ataxia. Negative Romberg. No meningismus.  No pronator drift. Normal sensation to bilateral face, upper and lower extremities.  Skin:  Skin is warm, dry and intact. No rash noted. Psychiatric: Mood and affect are normal. Speech and behavior are normal.  ___________________________________________   LABS (all labs ordered are listed, but only abnormal results are displayed)  Labs Reviewed - No data to display ____________________________________________  PROCEDURES Procedures    INITIAL IMPRESSION / ASSESSMENT AND PLAN / ED COURSE  Pertinent labs & imaging results that were available during my care of the patient were reviewed by me and considered in my medical decision making (see chart for details).  Well-appearing patient. No acute distress. Presents with complaints of gradual onset of headache. Reports chronic history of headaches as  well as current headache pain consistent with her past headaches. No focal neurological deficits. Well-appearing patient. Patient reports in the past IM Toradol helped and resolved headaches. Will administer 60 mg IM Toradol, 4 mg ODT Zofran. Reports after toradol, pain improving and ready to go home. States wants to be discharged home. Encourage rest, fluids and PCP follow up. Discussed strict follow-up and return parameters.Discussed indication, risks and benefits of medications with patient.  Discussed follow up with Primary care physician this week. Discussed follow up and return parameters including no resolution or any worsening concerns. Patient verbalized understanding and agreed to plan.   ____________________________________________   FINAL CLINICAL IMPRESSION(S) / ED DIAGNOSES  Final diagnoses:  Acute nonintractable headache, unspecified headache type     Discharge Medication List as of 09/06/2015  3:55 PM      Note: This dictation was prepared with Dragon dictation along with smaller phrase technology. Any transcriptional errors that result from this process are unintentional.    Clinical Course      Renford Dills, NP 09/06/15 1826

## 2015-09-06 NOTE — ED Triage Notes (Signed)
Headache since yesterday. States has been dx with migraines.

## 2015-09-06 NOTE — Discharge Instructions (Signed)
Take medication as prescribed. Rest. Drink plenty of fluids.  ° °Follow up with your primary care physician this week as needed. Return to Urgent care or ER for new or worsening concerns.  ° °

## 2015-09-30 ENCOUNTER — Other Ambulatory Visit: Payer: Self-pay | Admitting: Physician Assistant

## 2015-09-30 ENCOUNTER — Other Ambulatory Visit (HOSPITAL_COMMUNITY)
Admission: RE | Admit: 2015-09-30 | Discharge: 2015-09-30 | Disposition: A | Discharge status: Home / Self Care | Payer: 59 | Source: Ambulatory Visit | Attending: Family Medicine | Admitting: Family Medicine

## 2015-09-30 DIAGNOSIS — Z124 Encounter for screening for malignant neoplasm of cervix: Secondary | ICD-10-CM | POA: Insufficient documentation

## 2015-10-01 LAB — CYTOLOGY - PAP

## 2016-03-07 ENCOUNTER — Encounter: Payer: Self-pay | Admitting: Emergency Medicine

## 2016-03-07 DIAGNOSIS — L0231 Cutaneous abscess of buttock: Secondary | ICD-10-CM | POA: Diagnosis present

## 2016-03-07 DIAGNOSIS — Z792 Long term (current) use of antibiotics: Secondary | ICD-10-CM | POA: Diagnosis not present

## 2016-03-07 DIAGNOSIS — Z6831 Body mass index (BMI) 31.0-31.9, adult: Secondary | ICD-10-CM | POA: Diagnosis not present

## 2016-03-07 DIAGNOSIS — I959 Hypotension, unspecified: Secondary | ICD-10-CM | POA: Insufficient documentation

## 2016-03-07 DIAGNOSIS — D72829 Elevated white blood cell count, unspecified: Secondary | ICD-10-CM | POA: Insufficient documentation

## 2016-03-07 DIAGNOSIS — L03317 Cellulitis of buttock: Secondary | ICD-10-CM | POA: Diagnosis not present

## 2016-03-07 DIAGNOSIS — E669 Obesity, unspecified: Secondary | ICD-10-CM | POA: Diagnosis not present

## 2016-03-07 NOTE — ED Triage Notes (Signed)
Patient ambulatory to triage with steady gait, without difficulty or distress noted; pt reports abscess to left buttock x week

## 2016-03-08 ENCOUNTER — Emergency Department: Payer: 59

## 2016-03-08 ENCOUNTER — Observation Stay
Admission: EM | Admit: 2016-03-08 | Discharge: 2016-03-09 | Disposition: A | Discharge status: Home / Self Care | Payer: 59 | Attending: Internal Medicine | Admitting: Internal Medicine

## 2016-03-08 DIAGNOSIS — L0390 Cellulitis, unspecified: Secondary | ICD-10-CM | POA: Diagnosis present

## 2016-03-08 DIAGNOSIS — L03317 Cellulitis of buttock: Secondary | ICD-10-CM

## 2016-03-08 DIAGNOSIS — D72829 Elevated white blood cell count, unspecified: Secondary | ICD-10-CM

## 2016-03-08 LAB — BASIC METABOLIC PANEL
Anion gap: 7 (ref 5–15)
BUN: 9 mg/dL (ref 6–20)
CALCIUM: 9 mg/dL (ref 8.9–10.3)
CO2: 26 mmol/L (ref 22–32)
CREATININE: 0.77 mg/dL (ref 0.44–1.00)
Chloride: 102 mmol/L (ref 101–111)
Glucose, Bld: 109 mg/dL — ABNORMAL HIGH (ref 65–99)
Potassium: 3.6 mmol/L (ref 3.5–5.1)
SODIUM: 135 mmol/L (ref 135–145)

## 2016-03-08 LAB — CBC WITH DIFFERENTIAL/PLATELET
BASOS PCT: 0 %
Basophils Absolute: 0 10*3/uL (ref 0–0.1)
EOS ABS: 0 10*3/uL (ref 0–0.7)
Eosinophils Relative: 0 %
HCT: 39.8 % (ref 35.0–47.0)
HEMOGLOBIN: 14.1 g/dL (ref 12.0–16.0)
Lymphocytes Relative: 6 %
Lymphs Abs: 1.3 10*3/uL (ref 1.0–3.6)
MCH: 33.1 pg (ref 26.0–34.0)
MCHC: 35.4 g/dL (ref 32.0–36.0)
MCV: 93.5 fL (ref 80.0–100.0)
MONO ABS: 0.8 10*3/uL (ref 0.2–0.9)
MONOS PCT: 4 %
NEUTROS PCT: 90 %
Neutro Abs: 20.2 10*3/uL — ABNORMAL HIGH (ref 1.4–6.5)
Platelets: 431 10*3/uL (ref 150–440)
RBC: 4.26 MIL/uL (ref 3.80–5.20)
RDW: 12.3 % (ref 11.5–14.5)
WBC: 22.3 10*3/uL — ABNORMAL HIGH (ref 3.6–11.0)

## 2016-03-08 LAB — RAPID HIV SCREEN (HIV 1/2 AB+AG)
HIV 1/2 Antibodies: NONREACTIVE
HIV-1 P24 Antigen - HIV24: NONREACTIVE

## 2016-03-08 LAB — TSH: TSH: 1.298 u[IU]/mL (ref 0.350–4.500)

## 2016-03-08 LAB — LACTIC ACID, PLASMA
Lactic Acid, Venous: 0.8 mmol/L (ref 0.5–1.9)
Lactic Acid, Venous: 0.7 mmol/L (ref 0.5–1.9)

## 2016-03-08 LAB — POCT PREGNANCY, URINE: PREG TEST UR: NEGATIVE

## 2016-03-08 MED ORDER — IOPAMIDOL (ISOVUE-300) INJECTION 61%
100.0000 mL | Freq: Once | INTRAVENOUS | Status: AC | PRN
  Administered 2016-03-08: 100 mL via INTRAVENOUS

## 2016-03-08 MED ORDER — VANCOMYCIN HCL IN DEXTROSE 1-5 GM/200ML-% IV SOLN
1000.0000 mg | Freq: Once | INTRAVENOUS | Status: AC
  Administered 2016-03-08: 1000 mg via INTRAVENOUS
  Filled 2016-03-08: qty 200

## 2016-03-08 MED ORDER — OXYCODONE-ACETAMINOPHEN 5-325 MG PO TABS
1.0000 | ORAL_TABLET | Freq: Four times a day (QID) | ORAL | Status: DC | PRN
  Administered 2016-03-09 (×2): 1 via ORAL
  Filled 2016-03-08 (×2): qty 1

## 2016-03-08 MED ORDER — ONDANSETRON HCL 4 MG PO TABS: 4.0000 mg | ORAL_TABLET | Freq: Four times a day (QID) | ORAL | Status: DC | PRN

## 2016-03-08 MED ORDER — ONDANSETRON HCL 4 MG/2ML IJ SOLN
4.0000 mg | Freq: Once | INTRAMUSCULAR | Status: AC
  Administered 2016-03-08: 4 mg via INTRAVENOUS
  Filled 2016-03-08: qty 2

## 2016-03-08 MED ORDER — PANTOPRAZOLE SODIUM 40 MG PO TBEC
40.0000 mg | DELAYED_RELEASE_TABLET | Freq: Every day | ORAL | Status: DC
  Administered 2016-03-08 – 2016-03-09 (×2): 40 mg via ORAL
  Filled 2016-03-08 (×2): qty 1

## 2016-03-08 MED ORDER — ACETAMINOPHEN 325 MG PO TABS
650.0000 mg | ORAL_TABLET | Freq: Four times a day (QID) | ORAL | Status: DC | PRN
  Administered 2016-03-08: 650 mg via ORAL
  Filled 2016-03-08: qty 2

## 2016-03-08 MED ORDER — PIPERACILLIN-TAZOBACTAM 3.375 G IVPB
3.3750 g | Freq: Once | INTRAVENOUS | Status: AC
  Administered 2016-03-08: 3.375 g via INTRAVENOUS
  Filled 2016-03-08: qty 50

## 2016-03-08 MED ORDER — MORPHINE SULFATE (PF) 4 MG/ML IV SOLN
4.0000 mg | Freq: Once | INTRAVENOUS | Status: AC
  Administered 2016-03-08: 4 mg via INTRAVENOUS
  Filled 2016-03-08: qty 1

## 2016-03-08 MED ORDER — IOPAMIDOL (ISOVUE-300) INJECTION 61%
30.0000 mL | Freq: Once | INTRAVENOUS | Status: AC | PRN
  Administered 2016-03-08: 30 mL via ORAL

## 2016-03-08 MED ORDER — SODIUM CHLORIDE 0.9 % IV SOLN
INTRAVENOUS | Status: DC
  Administered 2016-03-08 (×2): via INTRAVENOUS

## 2016-03-08 MED ORDER — SULFAMETHOXAZOLE-TRIMETHOPRIM 800-160 MG PO TABS
2.0000 | ORAL_TABLET | Freq: Two times a day (BID) | ORAL | Status: DC
  Administered 2016-03-08 – 2016-03-09 (×3): 2 via ORAL
  Filled 2016-03-08 (×4): qty 2

## 2016-03-08 MED ORDER — ENOXAPARIN SODIUM 40 MG/0.4ML ~~LOC~~ SOLN
40.0000 mg | SUBCUTANEOUS | Status: DC
  Administered 2016-03-08: 40 mg via SUBCUTANEOUS
  Filled 2016-03-08: qty 0.4

## 2016-03-08 MED ORDER — DOCUSATE SODIUM 100 MG PO CAPS
100.0000 mg | ORAL_CAPSULE | Freq: Two times a day (BID) | ORAL | Status: DC
  Administered 2016-03-08 – 2016-03-09 (×3): 100 mg via ORAL
  Filled 2016-03-08 (×3): qty 1

## 2016-03-08 MED ORDER — ONDANSETRON HCL 4 MG/2ML IJ SOLN: 4.0000 mg | Freq: Four times a day (QID) | INTRAMUSCULAR | Status: DC | PRN

## 2016-03-08 MED ORDER — AMPHETAMINE-DEXTROAMPHETAMINE 5 MG PO TABS: 10.0000 mg | ORAL_TABLET | Freq: Every day | ORAL | Status: DC | PRN

## 2016-03-08 MED ORDER — ACETAMINOPHEN 650 MG RE SUPP
650.0000 mg | Freq: Four times a day (QID) | RECTAL | Status: DC | PRN
Start: 2016-03-08 — End: 2016-03-09

## 2016-03-08 MED ORDER — IBUPROFEN 400 MG PO TABS: 200.0000 mg | ORAL_TABLET | Freq: Four times a day (QID) | ORAL | Status: DC | PRN

## 2016-03-08 MED ORDER — SODIUM CHLORIDE 0.9 % IV BOLUS (SEPSIS)
1000.0000 mL | Freq: Once | INTRAVENOUS | Status: AC
  Administered 2016-03-08: 1000 mL via INTRAVENOUS

## 2016-03-08 NOTE — H&P (Signed)
Amber [name] is an 46 y.o. female.   Chief Complaint: Boil HPI: The patient withpast medical history presents to emergency department complaining of a boil in her groin. The patient states that a small pimple arose approximately 2 weeks ago that was initially painful and then ruptured. Her doctor prescribed amoxicillin at that time. The lesion receded but returned approximately 5 days ago. Last night the lesion ruptured and began draining. It is caused the patient's significant pain but she denies fever, nausea or vomiting. In the emergency department CT scan of her pelvis did not demonstrate abscess or subcutaneous gas pattern except 1 air bubble). The patient was started on vancomycin and Zosyn in the hospitalist service was called for admission.  Past Medical History:  Diagnosis Date  . Seasonal allergies   . TMJ disease     Past Surgical History:  Procedure Laterality Date  . cecerian section     x 2    Family History  Problem Relation Age of Onset  . Hypertension Other    Social History:  reports that she has never smoked. She has never used smokeless tobacco. She reports that she does not drink alcohol or use drugs.  Allergies:  Allergies  Allergen Reactions  . Diflucan [Fluconazole]   . Keflex [Cephalexin]     Prior to Admission medications   Medication Sig Start Date End Date Taking? Authorizing Provider  acetaminophen (TYLENOL) 500 MG tablet Take 500 mg by mouth every 6 (six) hours as needed for mild pain, moderate pain, fever or headache.   Yes Historical Provider, MD  amphetamine-dextroamphetamine (ADDERALL) 10 MG tablet Take 10 mg by mouth daily as needed (takes when she "has a lot to do").   Yes Historical Provider, MD  esomeprazole (NEXIUM) 20 MG capsule Take 20 mg by mouth daily at 12 noon.   Yes Historical Provider, MD  ibuprofen (ADVIL,MOTRIN) 200 MG tablet Take 200 mg by mouth every 6 (six) hours as needed for fever, headache, mild pain, moderate pain or  cramping.   Yes Historical Provider, MD  amoxicillin (AMOXIL) 500 MG capsule Take 1 capsule (500 mg total) by mouth 3 (three) times daily. Patient not taking: Reported on 03/08/2016 06/17/15   Konrad Felix, PA  chlorpheniramine-HYDROcodone [facility]) 10-8 MG/5ML SUER Take 5 mLs by mouth 2 (two) times daily. Patient not taking: Reported on 03/08/2016 11/18/14   Lorin Picket, PA-C  ondansetron (ZOFRAN) 4 MG tablet Take 1 tablet (4 mg total) by mouth every 6 (six) hours. Patient not taking: Reported on 03/08/2016 06/17/15   Konrad Felix, PA  oseltamivir (TAMIFLU) 75 MG capsule Take 1 capsule (75 mg total) by mouth every 12 (twelve) hours. Patient not taking: Reported on 03/08/2016 11/18/14   Lorin Picket, PA-C     Results for orders placed or performed during the hospital encounter of 03/08/16 (from the past 48 hour(s))  Culture, blood (routine x 2)     Status: None (Preliminary result)   Collection Time: 03/08/16  2:24 AM  Result Value Ref Range   Specimen Description BLOOD R AC    Special Requests BOTTLES DRAWN AEROBIC AND ANAEROBIC BCAV    Culture NO GROWTH < 12 HOURS    Report Status PENDING   Culture, blood (routine x 2)     Status: None (Preliminary result)   Collection Time: 03/08/16  2:24 AM  Result Value Ref Range   Specimen Description BLOOD L AC    Special Requests BOTTLES DRAWN AEROBIC  AND ANAEROBIC BCAV    Culture NO GROWTH < 12 HOURS    Report Status PENDING   CBC with Differential     Status: Abnormal   Collection Time: 03/08/16  2:24 AM  Result Value Ref Range   WBC 22.3 (H) 3.6 - 11.0 K/uL   RBC 4.26 3.80 - 5.20 MIL/uL   Hemoglobin 14.1 12.0 - 16.0 g/dL   HCT 39.8 35.0 - 47.0 %   MCV 93.5 80.0 - 100.0 fL   MCH 33.1 26.0 - 34.0 pg   MCHC 35.4 32.0 - 36.0 g/dL   RDW 12.3 11.5 - 14.5 %   Platelets 431 150 - 440 K/uL   Neutrophils Relative % 90 %   Neutro Abs 20.2 (H) 1.4 - 6.5 K/uL   Lymphocytes Relative 6 %   Lymphs Abs 1.3 1.0 - 3.6 K/uL    Monocytes Relative 4 %   Monocytes Absolute 0.8 0.2 - 0.9 K/uL   Eosinophils Relative 0 %   Eosinophils Absolute 0.0 0 - 0.7 K/uL   Basophils Relative 0 %   Basophils Absolute 0.0 0 - 0.1 K/uL  Basic metabolic panel     Status: Abnormal   Collection Time: 03/08/16  2:24 AM  Result Value Ref Range   Sodium 135 135 - 145 mmol/L   Potassium 3.6 3.5 - 5.1 mmol/L   Chloride 102 101 - 111 mmol/L   CO2 26 22 - 32 mmol/L   Glucose, Bld 109 (H) 65 - 99 mg/dL   BUN 9 6 - 20 mg/dL   Creatinine, Ser 0.77 0.44 - 1.00 mg/dL   Calcium 9.0 8.9 - 10.3 mg/dL   GFR calc non Af Amer >60 >60 mL/min   GFR calc Af Amer >60 >60 mL/min    Comment: (NOTE) The eGFR has been calculated using the CKD EPI equation. This calculation has not been validated in all clinical situations. eGFR's persistently <60 mL/min signify possible Chronic Kidney Disease.    Anion gap 7 5 - 15  Lactic acid, plasma     Status: None   Collection Time: 03/08/16  2:24 AM  Result Value Ref Range   Lactic Acid, Venous 0.8 0.5 - 1.9 mmol/L  Pregnancy, urine POC     Status: None   Collection Time: 03/08/16  4:01 AM  Result Value Ref Range   Preg Test, Ur NEGATIVE NEGATIVE    Comment:        THE SENSITIVITY OF THIS METHODOLOGY IS >24 mIU/mL   Lactic acid, plasma     Status: None   Collection Time: 03/08/16  6:49 AM  Result Value Ref Range   Lactic Acid, Venous 0.7 0.5 - 1.9 mmol/L   Ct Abdomen Pelvis W Contrast  Result Date: 03/08/2016 CLINICAL DATA:  Recurrent LEFT thigh abscess for 2 weeks, status post spontaneous drainage. Evaluate LEFT perineal abscess extending to labia. EXAM: CT ABDOMEN AND PELVIS WITH CONTRAST TECHNIQUE: Multidetector CT imaging of the abdomen and pelvis was performed using the standard protocol following bolus administration of intravenous contrast. CONTRAST:  135m ISOVUE-300 IOPAMIDOL (ISOVUE-300) INJECTION 61% COMPARISON:  None. FINDINGS: LOWER CHEST: Lung bases are clear. Included heart size is  normal. No pericardial effusion. HEPATOBILIARY: Liver and gallbladder are normal. PANCREAS: Normal. SPLEEN: Normal. ADRENALS/URINARY TRACT: Kidneys are orthotopic, demonstrating symmetric enhancement. No nephrolithiasis, hydronephrosis or solid renal masses. The unopacified ureters are normal in course and caliber. Delayed imaging through the kidneys demonstrates symmetric prompt contrast excretion within the proximal urinary collecting system. Urinary bladder  is partially distended and unremarkable. Normal adrenal glands. STOMACH/BOWEL: The stomach, small and large bowel are normal in course and caliber without inflammatory changes. Moderate retained large bowel stool. Normal appendix. VASCULAR/LYMPHATIC: Aortoiliac vessels are normal in course and caliber, trace calcific atherosclerosis. No lymphadenopathy by CT size criteria. REPRODUCTIVE: Normal. OTHER: Small amount of free fluid in the pelvis is likely physiologic. No intraperitoneal free air. MUSCULOSKELETAL: LEFT perineal skin thickening extending to medial thigh and labia with subcutaneous fat stranding and subcentimeter bubble of gas. No focal fluid collection. 15 mm short axis LEFT inguinal lymph node with inflammation. Osseous structures are nonsuspicious. IMPRESSION: LEFT perineal to labia and medial thigh cellulitis. No abscess; single bubble of subcutaneous gas. No subfascial extent. Mild reactive LEFT inguinal lymphadenopathy. No acute intra-abdominal or pelvic process. Electronically Signed   By: Elon Alas M.D.   On: 03/08/2016 04:57    Review of Systems  Constitutional: Negative for chills and fever.  HENT: Negative for sore throat and tinnitus.   Eyes: Negative for blurred vision and redness.  Respiratory: Negative for cough and shortness of breath.   Cardiovascular: Negative for chest pain, palpitations, orthopnea and PND.  Gastrointestinal: Negative for abdominal pain, diarrhea, nausea and vomiting.  Genitourinary: Negative for  dysuria, frequency and urgency.       Groin pain  Musculoskeletal: Negative for joint pain and myalgias.  Skin: Negative for rash.       No lesions  Neurological: Negative for speech change, focal weakness and weakness.  Endo/Heme/Allergies: Does not bruise/bleed easily.       No temperature intolerance  Psychiatric/Behavioral: Negative for depression and suicidal ideas.    Blood pressure (!) 100/51, pulse 88, temperature 98 F (36.7 C), temperature source Oral, resp. rate 18, height 5' 5"  (1.651 m), weight 84.8 kg (187 lb), last menstrual period 02/22/2016, SpO2 100 %. Physical Exam  Vitals reviewed. Constitutional: She is oriented to person, place, and time. She appears well-developed and well-nourished. No distress.  HENT:  Head: Normocephalic and atraumatic.  Mouth/Throat: Oropharynx is clear and moist.  Eyes: Conjunctivae and EOM are normal. Pupils are equal, round, and reactive to light. No scleral icterus.  Neck: Normal range of motion. Neck supple. No JVD present. No tracheal deviation present. No thyromegaly present.  Cardiovascular: Normal rate, regular rhythm and normal heart sounds.  Exam reveals no gallop and no friction rub.   No murmur heard. Respiratory: Effort normal and breath sounds normal.  GI: Soft. Bowel sounds are normal. She exhibits no distension. There is no tenderness.  Genitourinary: Vagina normal.     Lymphadenopathy:    She has no cervical adenopathy.  Neurological: She is alert and oriented to person, place, and time. No cranial nerve deficit. She exhibits normal muscle tone.  Skin: Skin is warm and dry. No rash noted. No erythema.  Psychiatric: She has a normal mood and affect. Her behavior is normal. Judgment and thought content normal.     Assessment/Plan This is a 46 year old female admitted for perineal cellulitis. 1. Cellulitis: No abscess on CT scan. The wound is draining. The patient only intermittently meets criteria for sepsis. She has  already received broad-spectrum IV antibiotics. As she currently has a moderate purulent cellulitis we can treat with oral medications per guidelines. Cultures were obtained in the emergency department which we will follow. Monitor for worsening clinical condition. No indication of Fournier's gangrene at this time. 2. Obesity: BMI is 31; encouraged healthy diet and exercise. 3. DVT prophylaxis: Lovenox 4. GI prophylaxis:  None The patient is a full code. Time spent on admission orders and patient care approximately 45 minutes  Harrie Foreman, MD 03/08/2016, 8:20 AM

## 2016-03-08 NOTE — ED Notes (Signed)
Pt c/o abscess to upper L thigh, sts that she noticed abscess 2 weeks ago.  Sts that it had grown, started draining and reduced, only for it to reoccur Monday.  Area tender to palpation, wound present that is firm on palpation, some drainage noted to area. Pt alert and oriented, resp even and unlabored, NAD

## 2016-03-08 NOTE — Progress Notes (Signed)
Dr. Emmit PomfretHugelmeyer notified of patient's request for something for abscess pain not relieved by tylenol.  Acknowledged, new order written. Windy Carinaurner,Adyan Palau K, RN 11:43 PM 11:43 PM

## 2016-03-08 NOTE — ED Provider Notes (Signed)
[facility] Emergency Department Provider Note   ____________________________________________   First MD Initiated Contact with Patient 03/08/16 0202     (approximate)  I have reviewed the triage vital signs and the nursing notes.   HISTORY  Chief Complaint Abscess    HPI ARLISS HEPBURN is a 46 y.o. female who presents to the ED from home with a chief complaint of abscess. Patient noted abscess to left buttock 2 weeks ago. States it began to drain last [name] and swelling subsided. The area stopped draining 2 days ago and patient complains of increased size as well as pain. States the area started to drain yesterday afternoon. Reports feeling achy in her thighs and pelvis. Denies fever, chills, chest pain, shortness of breath, abdominal pain, nausea, vomiting, diarrhea.Denies recent travel or trauma. Nothing makes her symptoms better or worse.   Past Medical History:  Diagnosis Date  . Seasonal allergies   . TMJ disease     Patient Active Problem List   Diagnosis Date Noted  . Cellulitis 03/08/2016    Past Surgical History:  Procedure Laterality Date  . cecerian section     x 2    Prior to Admission medications   Medication Sig Start Date End Date Taking? Authorizing Provider  acetaminophen (TYLENOL) 500 MG tablet Take 500 mg by mouth every 6 (six) hours as needed for mild pain, moderate pain, fever or headache.   Yes Historical Provider, MD  amphetamine-dextroamphetamine (ADDERALL) 10 MG tablet Take 10 mg by mouth daily as needed (takes when she "has a lot to do").   Yes Historical Provider, MD  esomeprazole (NEXIUM) 20 MG capsule Take 20 mg by mouth daily at 12 noon.   Yes Historical Provider, MD  ibuprofen (ADVIL,MOTRIN) 200 MG tablet Take 200 mg by mouth every 6 (six) hours as needed for fever, headache, mild pain, moderate pain or cramping.   Yes Historical Provider, MD  amoxicillin (AMOXIL) 500 MG capsule Take 1 capsule (500 mg total) by  mouth 3 (three) times daily. Patient not taking: Reported on 03/08/2016 06/17/15   Tharon Aquas, PA  chlorpheniramine-HYDROcodone [facility] ER) 10-8 MG/5ML SUER Take 5 mLs by mouth 2 (two) times daily. Patient not taking: Reported on 03/08/2016 11/18/14   Lutricia Feil, PA-C  ondansetron (ZOFRAN) 4 MG tablet Take 1 tablet (4 mg total) by mouth every 6 (six) hours. Patient not taking: Reported on 03/08/2016 06/17/15   Tharon Aquas, PA  oseltamivir (TAMIFLU) 75 MG capsule Take 1 capsule (75 mg total) by mouth every 12 (twelve) hours. Patient not taking: Reported on 03/08/2016 11/18/14   Lutricia Feil, PA-C    Allergies Diflucan [fluconazole] and Keflex [cephalexin]  No family history on file.  Social History Social History  Substance Use Topics  . Smoking status: Never Smoker  . Smokeless tobacco: Never Used  . Alcohol use No    Review of Systems  Constitutional: No fever/chills. Eyes: No visual changes. ENT: No sore throat. Cardiovascular: Denies chest pain. Respiratory: Denies shortness of breath. Gastrointestinal: No abdominal pain.  No nausea, no vomiting.  No diarrhea.  No constipation. Genitourinary: Negative for dysuria. Musculoskeletal: Negative for back pain. Skin: Positive for abscess. Negative for rash. Neurological: Negative for headaches, focal weakness or numbness.  10-point ROS otherwise negative.  ____________________________________________   PHYSICAL EXAM:  VITAL SIGNS: ED Triage Vitals  Enc Vitals Group     BP 03/07/16 2256 121/65     Pulse Rate 03/07/16 2256 Marland Kitchen)  106     Resp 03/07/16 2256 18     Temp 03/07/16 2256 99.3 F (37.4 C)     Temp Source 03/07/16 2256 Oral     SpO2 03/07/16 2256 100 %     Weight 03/07/16 2257 187 lb (84.8 kg)     Height 03/07/16 2257 5\' 5"  (1.651 m)     Head Circumference --      Peak Flow --      Pain Score 03/07/16 2257 7     Pain Loc --      Pain Edu? --      Excl. in GC? --      Constitutional: Alert and oriented. Well appearing and in mild acute distress. Eyes: Conjunctivae are normal. PERRL. EOMI. Head: Atraumatic. Nose: No congestion/rhinnorhea. Mouth/Throat: Mucous membranes are moist.  Oropharynx non-erythematous. Neck: No stridor.   Cardiovascular: Normal rate, regular rhythm. Grossly normal heart sounds.  Good peripheral circulation. Respiratory: Normal respiratory effort.  No retractions. Lungs CTAB. Gastrointestinal: Soft and nontender. No distention. No abdominal bruits. No CVA tenderness. Genitourinary: Draining perineal abscess. Area of induration tracking up to left outer labia. Mild warmth/erythema. Musculoskeletal: No lower extremity tenderness nor edema.  No joint effusions. Neurologic:  Normal speech and language. No gross focal neurologic deficits are appreciated. No gait instability. Skin:  Skin is warm, dry and intact. No rash noted. Psychiatric: Mood and affect are normal. Speech and behavior are normal.  ____________________________________________   LABS (all labs ordered are listed, but only abnormal results are displayed)  Labs Reviewed  CBC WITH DIFFERENTIAL/PLATELET - Abnormal; Notable for the following:       Result Value   WBC 22.3 (*)    Neutro Abs 20.2 (*)    All other components within normal limits  BASIC METABOLIC PANEL - Abnormal; Notable for the following:    Glucose, Bld 109 (*)    All other components within normal limits  CULTURE, BLOOD (ROUTINE X 2)  CULTURE, BLOOD (ROUTINE X 2)  AEROBIC/ANAEROBIC CULTURE (SURGICAL/DEEP WOUND)  LACTIC ACID, PLASMA  LACTIC ACID, PLASMA  POCT PREGNANCY, URINE   ____________________________________________  EKG  None ____________________________________________  RADIOLOGY  CT abdomen and pelvis with contrast interpreted per Dr. Karie Kirks: LEFT perineal to labia and medial thigh cellulitis. No abscess;  single bubble of subcutaneous gas. No subfascial extent.    Mild  reactive LEFT inguinal lymphadenopathy.    No acute intra-abdominal or pelvic process.     ____________________________________________   PROCEDURES  Procedure(s) performed: None  Procedures  Critical Care performed: No  ____________________________________________   INITIAL IMPRESSION / ASSESSMENT AND PLAN / ED COURSE  Pertinent labs & imaging results that were available during my care of the patient were reviewed by me and considered in my medical decision making (see chart for details).  46 year old female who presents with a draining perineal abscess with induration to her left labia; symptoms are concerning for Fournier's. Will obtain screening lab work including blood cultures and lactate, CT abdomen/pelvis to further characterize depth of abscess.  Clinical Course as of Mar 08 656  Wed Mar 08, 2016  1610 Patient was updated of her laboratory imaging results and she was admitted to the hospitalist service for IV antibiotics.  [JS]    Clinical Course User Index [JS] Irean Hong, MD     ____________________________________________   FINAL CLINICAL IMPRESSION(S) / ED DIAGNOSES  Final diagnoses:  Cellulitis of buttock  Leukocytosis, unspecified type      NEW MEDICATIONS STARTED DURING  THIS VISIT:  New Prescriptions   No medications on file     Note:  This document was prepared using Dragon voice recognition software and may include unintentional dictation errors.    Irean HongJade J Sung, MD 03/08/16 [phone]

## 2016-03-08 NOTE — Progress Notes (Signed)
[employer] [facility] -  at [facility]   PATIENT NAME: Amber [name]    MR#:  [id number]  DATE OF BIRTH:  25-Aug-1970  SUBJECTIVE:  CHIEF COMPLAINT:   Chief Complaint  Patient presents with  . Abscess  Having some pain at the site of cellulitis and more uncomfortable feels her perineum may be getting some swelling REVIEW OF SYSTEMS:  Review of Systems  Constitutional: Negative for chills, fever and weight loss.  HENT: Negative for nosebleeds and sore throat.   Eyes: Negative for blurred vision.  Respiratory: Negative for cough, shortness of breath and wheezing.   Cardiovascular: Negative for chest pain, orthopnea, leg swelling and PND.  Gastrointestinal: Negative for abdominal pain, constipation, diarrhea, heartburn, nausea and vomiting.  Genitourinary: Negative for dysuria and urgency.  Musculoskeletal: Negative for back pain.  Skin: Positive for rash.  Neurological: Negative for dizziness, speech change, focal weakness and headaches.  Endo/Heme/Allergies: Does not bruise/bleed easily.  Psychiatric/Behavioral: Negative for depression.    DRUG ALLERGIES:   Allergies  Allergen Reactions  . Diflucan [Fluconazole]   . Keflex [Cephalexin]    VITALS:  Blood pressure (!) 96/48, pulse 81, temperature 98.5 F (36.9 C), temperature source Oral, resp. rate 18, height 5\' 5"  (1.651 m), weight 84.8 kg (187 lb), last menstrual period 02/22/2016, SpO2 100 %. PHYSICAL EXAMINATION:  Physical Exam  Constitutional: She is oriented to person, place, and time and well-developed, well-nourished, and in no distress.  HENT:  Head: Normocephalic and atraumatic.  Eyes: Conjunctivae and EOM are normal. Pupils are equal, round, and reactive to light.  Neck: Normal range of motion. Neck supple. No tracheal deviation present. No thyromegaly present.  Cardiovascular: Normal rate, regular rhythm and normal heart sounds.   Pulmonary/Chest: Effort normal and breath sounds normal. No  respiratory distress. She has no wheezes. She exhibits no tenderness.  Abdominal: Soft. Bowel sounds are normal. She exhibits no distension. There is no tenderness.  Musculoskeletal: Normal range of motion.  Neurological: She is alert and oriented to person, place, and time. No cranial nerve deficit.  Skin: Skin is warm and dry. Lesion noted. No rash noted. There is erythema.     She has a open wound on her anal crease, I do not see any major signs of erythema but she does have some tenderness there on examination  Psychiatric: Mood and affect normal.   LABORATORY PANEL:  Female CBC  Recent Labs Lab 03/08/16 0224  WBC 22.3*  HGB 14.1  HCT 39.8  PLT 431   ------------------------------------------------------------------------------------------------------------------ Chemistries   Recent Labs Lab 03/08/16 0224  NA 135  K 3.6  CL 102  CO2 26  GLUCOSE 109*  BUN 9  CREATININE 0.77  CALCIUM 9.0   RADIOLOGY:  Ct Abdomen Pelvis W Contrast  Result Date: 03/08/2016 CLINICAL DATA:  Recurrent LEFT thigh abscess for 2 weeks, status post spontaneous drainage. Evaluate LEFT perineal abscess extending to labia. EXAM: CT ABDOMEN AND PELVIS WITH CONTRAST TECHNIQUE: Multidetector CT imaging of the abdomen and pelvis was performed using the standard protocol following bolus administration of intravenous contrast. CONTRAST:  ISOVUE-300 IOPAMIDOL (ISOVUE-300) INJECTION 61% COMPARISON:  None. FINDINGS: LOWER CHEST: Lung bases are clear. Included heart size is normal. No pericardial effusion. HEPATOBILIARY: Liver and gallbladder are normal. PANCREAS: Normal. SPLEEN: Normal. ADRENALS/URINARY TRACT: Kidneys are orthotopic, demonstrating symmetric enhancement. No nephrolithiasis, hydronephrosis or solid renal masses. The unopacified ureters are normal in course and caliber. Delayed imaging through the kidneys demonstrates symmetric prompt contrast excretion within the  proximal urinary collecting  system. Urinary bladder is partially distended and unremarkable. Normal adrenal glands. STOMACH/BOWEL: The stomach, small and large bowel are normal in course and caliber without inflammatory changes. Moderate retained large bowel stool. Normal appendix. VASCULAR/LYMPHATIC: Aortoiliac vessels are normal in course and caliber, trace calcific atherosclerosis. No lymphadenopathy by CT size criteria. REPRODUCTIVE: Normal. OTHER: Small amount of free fluid in the pelvis is likely physiologic. No intraperitoneal free air. MUSCULOSKELETAL: LEFT perineal skin thickening extending to medial thigh and labia with subcutaneous fat stranding and subcentimeter bubble of gas. No focal fluid collection. 15 mm short axis LEFT inguinal lymph node with inflammation. Osseous structures are nonsuspicious. IMPRESSION: LEFT perineal to labia and medial thigh cellulitis. No abscess; single bubble of subcutaneous gas. No subfascial extent. Mild reactive LEFT inguinal lymphadenopathy. No acute intra-abdominal or pelvic process. Electronically Signed   By: Awilda Metroourtnay  Bloomer M.D.   On: 03/08/2016 04:57   ASSESSMENT AND PLAN:  This is a 46 year old female admitted for perineal cellulitis.  1. Cellulitis: No abscess on CT scan. -No sepsis -Continue oral Bactrim  -Await blood cultures  * Leukocytosis -Continue oral Bactrim and monitor white count  *Hypotension -Likely due to infection and/or dehydration continue IV fluids and monitor blood pressure  * Obesity: BMI is 31; encouraged healthy diet and exercise.      All the records are reviewed and case discussed with Care Management/Social Worker. Management plans discussed with the patient, nursing and they are in agreement.  CODE STATUS: Full Code  TOTAL TIME TAKING CARE OF THIS PATIENT: 35 minutes.   More than 50% of the time was spent in counseling/coordination of care: YES  POSSIBLE D/C IN 1 DAYS, DEPENDING ON CLINICAL CONDITION.   Delfino LovettVipul Mikele Sifuentes M.D on 03/08/2016  at 5:23 PM  Between 7am to 6pm - Pager - [phone]  After 6pm go to [email or web address] - Social research officer, governmentpassword [email or web address]  [facility]  Office  [phone]  CC: Primary care physician; REDMAN,MICHELLE, MD  Note: This dictation was prepared with Dragon dictation along with smaller phrase technology. Any transcriptional errors that result from this process are unintentional.

## 2016-03-08 NOTE — Progress Notes (Signed)
Septra dosed appropriately for renal function. Pharmacy will adjust dose if renal changes occur.   Demetrius Charityeldrin D. Felcia Huebert, PharmD

## 2016-03-08 NOTE — Progress Notes (Signed)
Care and Treatment transferred to Adela Glimpseonya Jeimy Bickert, RN.  Received pt as a transfer from [place], Charity fundraiserN. Pt AOx4. VSS. No signs of acute distress. IV access intact infusing. Assessment completed. Education provided on call bell, bed alarm, telephone and IV/IV Pole/IV Alarms. Education presented on use of [employer] as a Network engineerpatient/nurse communication tool. White Board was completed and updated  Dietary specification confirmed.   Pt resting comfortably and quietly w/bed in low and locked position and call bell and telephone within reach. Will continue to monitor.

## 2016-03-09 LAB — BASIC METABOLIC PANEL
Anion gap: 5 (ref 5–15)
BUN: 9 mg/dL (ref 6–20)
CHLORIDE: 108 mmol/L (ref 101–111)
CO2: 25 mmol/L (ref 22–32)
Calcium: 8.1 mg/dL — ABNORMAL LOW (ref 8.9–10.3)
Creatinine, Ser: 0.85 mg/dL (ref 0.44–1.00)
GFR calc Af Amer: >60 mL/min (ref >60)
GFR calc non Af Amer: >60 mL/min (ref >60)
GLUCOSE: 89 mg/dL (ref 65–99)
POTASSIUM: 3.8 mmol/L (ref 3.5–5.1)
Sodium: 138 mmol/L (ref 135–145)

## 2016-03-09 LAB — CBC
HEMATOCRIT: 34 % — AB (ref 35.0–47.0)
Hemoglobin: 11.8 g/dL — ABNORMAL LOW (ref 12.0–16.0)
MCH: 32.7 pg (ref 26.0–34.0)
MCHC: 34.7 g/dL (ref 32.0–36.0)
MCV: 94.4 fL (ref 80.0–100.0)
Platelets: 349 10*3/uL (ref 150–440)
RBC: 3.6 MIL/uL — AB (ref 3.80–5.20)
RDW: 12.4 % (ref 11.5–14.5)
WBC: 13.3 10*3/uL — ABNORMAL HIGH (ref 3.6–11.0)

## 2016-03-09 LAB — HEMOGLOBIN A1C
Hgb A1c MFr Bld: 5.2 % (ref 4.8–5.6)
Mean Plasma Glucose: 103 mg/dL

## 2016-03-09 MED ORDER — SULFAMETHOXAZOLE-TRIMETHOPRIM 800-160 MG PO TABS: 2.0000 | ORAL_TABLET | Freq: Two times a day (BID) | ORAL | 0 refills | Status: DC

## 2016-03-09 MED ORDER — OXYCODONE-ACETAMINOPHEN 5-325 MG PO TABS: 1.0000 | ORAL_TABLET | Freq: Every day | ORAL | 0 refills | Status: DC | PRN

## 2016-03-09 MED ORDER — IBUPROFEN 400 MG PO TABS: 400.0000 mg | ORAL_TABLET | Freq: Four times a day (QID) | ORAL | 0 refills | Status: DC | PRN

## 2016-03-09 NOTE — Progress Notes (Signed)
Pt d/c to home today.  IV removed intact.  Rx's given to pt w/all questions and concerns addressed.  D/C paperwork reviewed and education provided with all questions and concerns addressed.   

## 2016-03-09 NOTE — Discharge Instructions (Signed)
Sulfamethoxazole; Trimethoprim, SMX-TMP tablets What is this medicine? SULFAMETHOXAZOLE; TRIMETHOPRIM or SMX-TMP (suhl fuh meth OK suh zohl; trye METH oh prim) is a combination of a sulfonamide antibiotic and a second antibiotic, trimethoprim. It is used to treat or prevent certain kinds of bacterial infections. It will not work for colds, flu, or other viral infections. This medicine may be used for other purposes; ask your health care provider or pharmacist if you have questions. COMMON BRAND NAME(S): Bacter-Aid DS, Bactrim, Bactrim DS, Septra, Septra DS What should I tell my health care provider before I take this medicine? They need to know if you have any of these conditions: -anemia -asthma -being treated with anticonvulsants -if you frequently drink alcohol containing drinks -kidney disease -liver disease -low level of folic acid or ZOXWRUE-4-[id number]-6-phosphate dehydrogenase -poor nutrition or malabsorption -porphyria -severe allergies -thyroid disorder -an unusual or allergic reaction to sulfamethoxazole, trimethoprim, sulfa drugs, other medicines, foods, dyes, or preservatives -pregnant or trying to get pregnant -breast-feeding How should I use this medicine? Take this medicine by mouth with a full glass of water. Follow the directions on the prescription label. Take your medicine at regular intervals. Do not take it more often than directed. Do not skip doses or stop your medicine early. Talk to your pediatrician regarding the use of this medicine in children. Special care may be needed. This medicine has been used in children as young as 552 months of age. Overdosage: If you think you have taken too much of this medicine contact a poison control center or emergency room at once. NOTE: This medicine is only for you. Do not share this medicine with others. What if I miss a dose? If you miss a dose, take it as soon as you can. If it is almost time for your next dose, take only that dose. Do  not take double or extra doses. What may interact with this medicine? Do not take this medicine with any of the following medications: -aminobenzoate potassium -dofetilide -metronidazole This medicine may also interact with the following medications: -ACE inhibitors like benazepril, enalapril, lisinopril, and ramipril -birth control pills -cyclosporine -digoxin -diuretics -indomethacin -medicines for diabetes -methenamine -methotrexate -phenytoin -potassium supplements -pyrimethamine -sulfinpyrazone -tricyclic antidepressants -warfarin This list may not describe all possible interactions. Give your health care provider a list of all the medicines, herbs, non-prescription drugs, or dietary supplements you use. Also tell them if you smoke, drink alcohol, or use illegal drugs. Some items may interact with your medicine. What should I watch for while using this medicine? Tell your doctor or health care professional if your symptoms do not improve. Drink several glasses of water a day to reduce the risk of kidney problems. Do not treat diarrhea with over the counter products. Contact your doctor if you have diarrhea that lasts more than 2 days or if it is severe and watery. This medicine can make you more sensitive to the sun. Keep out of the sun. If you cannot avoid being in the sun, wear protective clothing and use a sunscreen. Do not use sun lamps or tanning beds/booths. What side effects may I notice from receiving this medicine? Side effects that you should report to your doctor or health care professional as soon as possible: -allergic reactions like skin rash or hives, swelling of the face, lips, or tongue -breathing problems -fever or chills, sore throat -irregular heartbeat, chest pain -joint or muscle pain -pain or difficulty passing urine -red pinpoint spots on skin -redness, blistering, peeling or loosening of  the skin, including inside the mouth -unusual bleeding or  bruising -unusually weak or tired -yellowing of the eyes or skin Side effects that usually do not require medical attention (report to your doctor or health care professional if they continue or are bothersome): -diarrhea -dizziness -headache -loss of appetite -nausea, vomiting -nervousness This list may not describe all possible side effects. Call your doctor for medical advice about side effects. You may report side effects to FDA at 1-800-FDA-1088. Where should I keep my medicine? Keep out of the reach of children. Store at room temperature between 20 to 25 degrees C (68 to 77 degrees F). Protect from light. Throw away any unused medicine after the expiration date. NOTE: This sheet is a summary. It may not cover all possible information. If you have questions about this medicine, talk to your doctor, pharmacist, or health care provider.  2018 Elsevier/Gold Standard (2012-08-02 14:38:26)   Cellulitis, Adult Cellulitis is a skin infection. The infected area is usually red and sore. This condition occurs most often in the arms and lower legs. It is very important to get treated for this condition. Follow these instructions at home:  Take over-the-counter and prescription medicines only as told by your doctor.  If you were prescribed an antibiotic medicine, take it as told by your doctor. Do not stop taking the antibiotic even if you start to feel better.  Drink enough fluid to keep your pee (urine) clear or pale yellow.  Do not touch or rub the infected area.  Raise (elevate) the infected area above the level of your heart while you are sitting or lying down.  Place warm or cold wet cloths (warm or cold compresses) on the infected area. Do this as told by your doctor.  Keep all follow-up visits as told by your doctor. This is important. These visits let your doctor make sure your infection is not getting worse. Contact a doctor if:  You have a fever.  Your symptoms do not get  better after 1-2 days of treatment.  Your bone or joint under the infected area starts to hurt after the skin has healed.  Your infection comes back. This can happen in the same area or another area.  You have a swollen bump in the infected area.  You have new symptoms.  You feel ill and also have muscle aches and pains. Get help right away if:  Your symptoms get worse.  You feel very sleepy.  You throw up (vomit) or have watery poop (diarrhea) for a long time.  There are red streaks coming from the infected area.  Your red area gets larger.  Your red area turns darker. This information is not intended to replace advice given to you by your health care provider. Make sure you discuss any questions you have with your health care provider. Document Released: 06/14/2007 Document Revised: 06/03/2015 Document Reviewed: 11/04/2014 Elsevier Interactive Patient Education  2017 [employer].

## 2016-03-09 NOTE — Progress Notes (Signed)
Patient is currently resting. Will continue to monitor patient to end of shift.

## 2016-03-10 NOTE — Discharge Summary (Signed)
5        [facility] [place] [facility]   PATIENT NAME: Amber CheshireSherry [name]    MR#:  [id number]  DATE OF BIRTH:  12/12/1970  DATE OF ADMISSION:  03/08/2016   ADMITTING PHYSICIAN: Arnaldo NatalMichael S Diamond, MD  DATE OF DISCHARGE: 03/09/2016 11:43 AM  PRIMARY CARE PHYSICIAN: REDMAN,MICHELLE, MD   ADMISSION DIAGNOSIS:  Cellulitis of buttock [L03.317] Leukocytosis, unspecified type [D72.829] DISCHARGE DIAGNOSIS:  Active Problems:   Cellulitis  SECONDARY DIAGNOSIS:   Past Medical History:  Diagnosis Date  . Seasonal allergies   . TMJ disease    HOSPITAL COURSE:  This is a 46 year old female admitted for perineal cellulitis.  * Cellulitis: No abscess on CT scan. -No sepsis -Continue oral Bactrim   * Leukocytosis - improving on oral Bactrim.  *Hypotension -Likely due to infection. Resolved with IV fluids.  DISCHARGE CONDITIONS:  stable CONSULTS OBTAINED:   DRUG ALLERGIES:   Allergies  Allergen Reactions  . Diflucan [Fluconazole]   . Keflex [Cephalexin]    DISCHARGE MEDICATIONS:   Allergies as of 03/09/2016      Reactions   Diflucan [fluconazole]    Keflex [cephalexin]       Medication List    STOP taking these medications   amoxicillin 500 MG capsule Commonly known as:  AMOXIL   chlorpheniramine-HYDROcodone 10-8 MG/5ML Suer Commonly known as:  TUSSIONEX PENNKINETIC ER     TAKE these medications   acetaminophen 500 MG tablet Commonly known as:  TYLENOL Take 500 mg by mouth every 6 (six) hours as needed for mild pain, moderate pain, fever or headache.   amphetamine-dextroamphetamine 10 MG tablet Commonly known as:  ADDERALL Take 10 mg by mouth daily as needed (takes when she "has a lot to do").   esomeprazole 20 MG capsule Commonly known as:  NEXIUM Take 20 mg by mouth daily at 12 noon.   ibuprofen 400 MG tablet Commonly known as:  ADVIL,MOTRIN Take 1 tablet (400 mg total) by mouth every 6 (six) hours as needed for fever,  headache, mild pain, moderate pain or cramping. What changed:  medication strength  how much to take   ondansetron 4 MG tablet Commonly known as:  ZOFRAN Take 1 tablet (4 mg total) by mouth every 6 (six) hours.   oseltamivir 75 MG capsule Commonly known as:  TAMIFLU Take 1 capsule (75 mg total) by mouth every 12 (twelve) hours.   oxyCODONE-acetaminophen 5-325 MG tablet Commonly known as:  PERCOCET/ROXICET Take 1 tablet by mouth daily as needed for moderate pain or severe pain.   sulfamethoxazole-trimethoprim 800-160 MG tablet Commonly known as:  BACTRIM DS,SEPTRA DS Take 2 tablets by mouth 2 (two) times daily.      DISCHARGE INSTRUCTIONS:   DIET:  Regular diet DISCHARGE CONDITION:  Good ACTIVITY:  Activity as tolerated OXYGEN:  Home Oxygen: No.  Oxygen Delivery: room air DISCHARGE LOCATION:  home   If you experience worsening of your admission symptoms, develop shortness of breath, life threatening emergency, suicidal or homicidal thoughts you must seek medical attention immediately by calling 911 or calling your MD immediately  if symptoms less severe.  You Must read complete instructions/literature along with all the possible adverse reactions/side effects for all the Medicines you take and that have been prescribed to you. Take any new Medicines after you have completely understood and accpet all the possible adverse reactions/side effects.   Please note  You were cared for by a hospitalist during your hospital stay. If you  have any questions about your discharge medications or the care you received while you were in the hospital after you are discharged, you can call the unit and asked to speak with the hospitalist on call if the hospitalist that took care of you is not available. Once you are discharged, your primary care physician will handle any further medical issues. Please note that NO REFILLS for any discharge medications will be authorized once you are  discharged, as it is imperative that you return to your primary care physician (or establish a relationship with a primary care physician if you do not have one) for your aftercare needs so that they can reassess your need for medications and monitor your lab values.    On the day of Discharge:  VITAL SIGNS:  Blood pressure (!) 103/53, pulse 72, temperature 98.5 F (36.9 C), temperature source Oral, resp. rate 16, height 5\' 5"  (1.651 m), weight 87 kg (191 lb 14.4 oz), last menstrual period 02/22/2016, SpO2 100 %. PHYSICAL EXAMINATION:  GENERAL:  46 y.o.-year-old patient lying in the bed with no acute distress.  EYES: Pupils equal, round, reactive to light and accommodation. No scleral icterus. Extraocular muscles intact.  HEENT: Head atraumatic, normocephalic. Oropharynx and nasopharynx clear.  NECK:  Supple, no jugular venous distention. No thyroid enlargement, no tenderness.  LUNGS: Normal breath sounds bilaterally, no wheezing, rales,rhonchi or crepitation. No use of accessory muscles of respiration.  CARDIOVASCULAR: S1, S2 normal. No murmurs, rubs, or gallops.  ABDOMEN: Soft, non-tender, non-distended. Bowel sounds present. No organomegaly or mass.  EXTREMITIES: No pedal edema, cyanosis, or clubbing.  NEUROLOGIC: Cranial nerves II through XII are intact. Muscle strength 5/5 in all extremities. Sensation intact. Gait not checked.  PSYCHIATRIC: The patient is alert and oriented x 3.  SKIN: No obvious rash, lesion, or ulcer.  DATA REVIEW:   CBC  Recent Labs Lab 03/09/16 0500  WBC 13.3*  HGB 11.8*  HCT 34.0*  PLT 349    Chemistries   Recent Labs Lab 03/09/16 0500  NA 138  K 3.8  CL 108  CO2 25  GLUCOSE 89  BUN 9  CREATININE 0.85  CALCIUM 8.1*    Follow-up Information    REDMAN,MICHELLE, MD. Schedule an appointment as soon as possible for a visit in 1 week(s).   Specialty:  Internal Medicine       REDMON,NOELLE, PA-C. Go to.   Specialty:  Nurse Practitioner Why:   Ms. Redmon's office will call you with an appointment.  Please call if you don't hear from them by tomorrow [phone] Contact information: [address] [place] [address] [phone]          Management plans discussed with the patient, family and they are in agreement.  CODE STATUS: Prior   TOTAL TIME TAKING CARE OF THIS PATIENT: 45 minutes.    Delfino Lovett M.D on 03/10/2016 at 2:20 PM  Between 7am to 6pm - Pager - [phone]  After 6pm go to [email or web address] - Social research officer, government  [facility]  Office  [phone]  CC: Primary care physician; REDMAN,MICHELLE, MD   Note: This dictation was prepared with Dragon dictation along with smaller phrase technology. Any transcriptional errors that result from this process are unintentional.

## 2016-03-12 LAB — AEROBIC/ANAEROBIC CULTURE W GRAM STAIN (SURGICAL/DEEP WOUND)

## 2016-03-13 LAB — CULTURE, BLOOD (ROUTINE X 2)
CULTURE: NO GROWTH
CULTURE: NO GROWTH

## 2016-07-07 DIAGNOSIS — G5603 Carpal tunnel syndrome, bilateral upper limbs: Secondary | ICD-10-CM | POA: Insufficient documentation

## 2016-10-18 DIAGNOSIS — M7711 Lateral epicondylitis, right elbow: Secondary | ICD-10-CM | POA: Diagnosis not present

## 2016-10-18 DIAGNOSIS — G5603 Carpal tunnel syndrome, bilateral upper limbs: Secondary | ICD-10-CM | POA: Diagnosis not present

## 2016-11-15 DIAGNOSIS — G5603 Carpal tunnel syndrome, bilateral upper limbs: Secondary | ICD-10-CM | POA: Diagnosis not present

## 2016-11-15 DIAGNOSIS — M7711 Lateral epicondylitis, right elbow: Secondary | ICD-10-CM | POA: Diagnosis not present

## 2016-11-20 DIAGNOSIS — F900 Attention-deficit hyperactivity disorder, predominantly inattentive type: Secondary | ICD-10-CM | POA: Diagnosis not present

## 2016-12-15 DIAGNOSIS — M7711 Lateral epicondylitis, right elbow: Secondary | ICD-10-CM | POA: Diagnosis not present

## 2016-12-15 DIAGNOSIS — G5603 Carpal tunnel syndrome, bilateral upper limbs: Secondary | ICD-10-CM | POA: Diagnosis not present

## 2017-03-14 DIAGNOSIS — R300 Dysuria: Secondary | ICD-10-CM | POA: Diagnosis not present

## 2017-03-29 IMAGING — CT CT ABD-PELV W/ CM
2 of 8 series · 15 of 46 positions shown, 17 images · IV contrast (APPLIED)
Comparison: None.

CLINICAL DATA: Recurrent LEFT thigh abscess for 2 weeks, status
post spontaneous drainage. Evaluate LEFT perineal abscess extending
to labia.

EXAM:
CT ABDOMEN AND PELVIS WITH CONTRAST
TECHNIQUE: Multidetector CT imaging of the abdomen and pelvis was performed
using the standard protocol following bolus administration of
intravenous contrast.
CONTRAST:  100mL [id number] IOPAMIDOL ([id number]) INJECTION 61%

[Series 2: routine abd/pel with · axial · 0.77mm/px · z∈[-957,-557]mm · 12 of 94 slices shown, 14 images]
[im 7/94  soft-tissue]
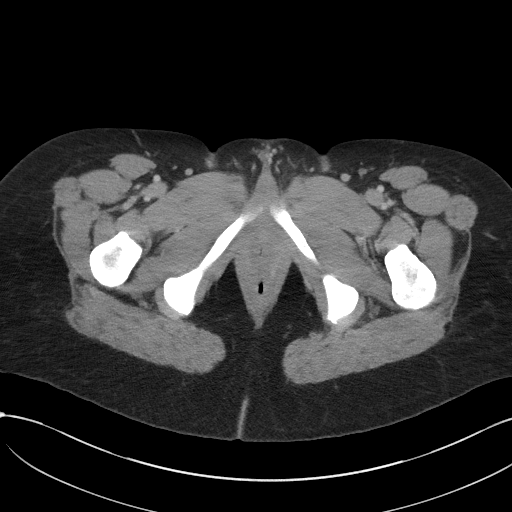
[im 7/94  bone]
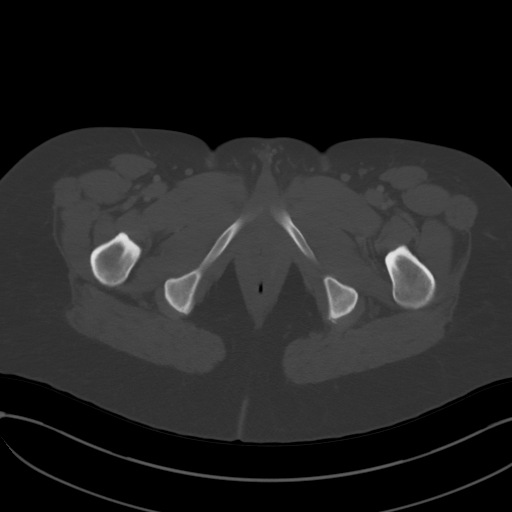
[im 13/94  soft-tissue]
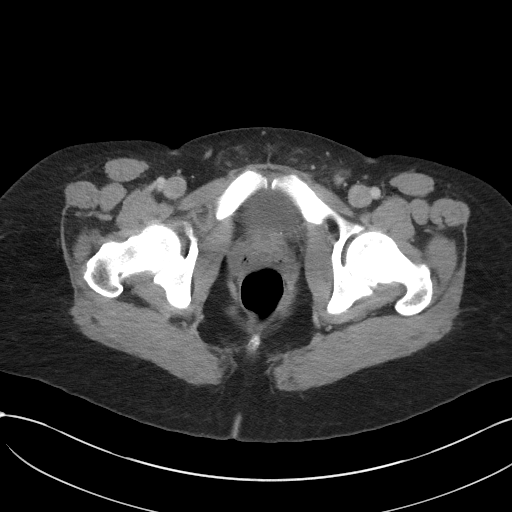
[im 19/94  soft-tissue]
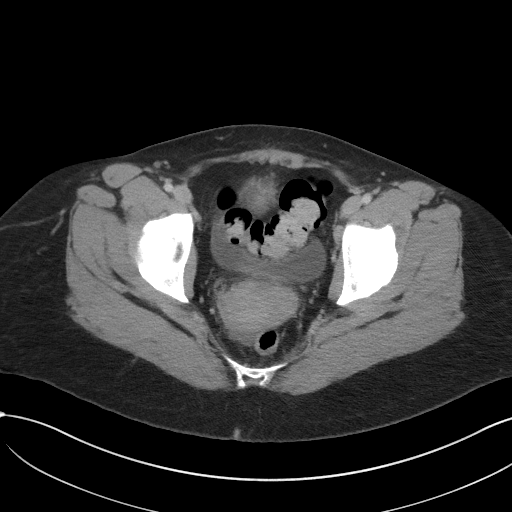
[im 32/94  soft-tissue]
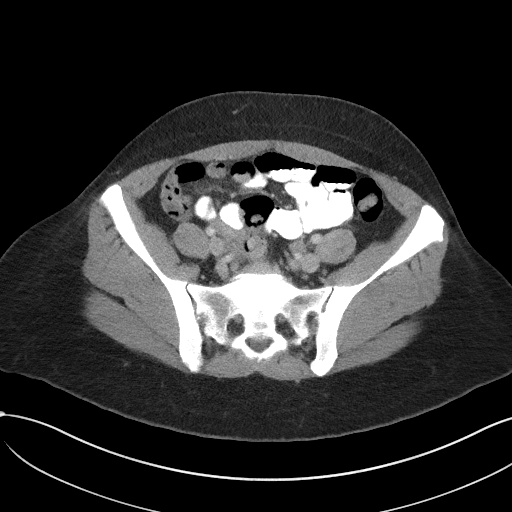
[im 38/94  soft-tissue]
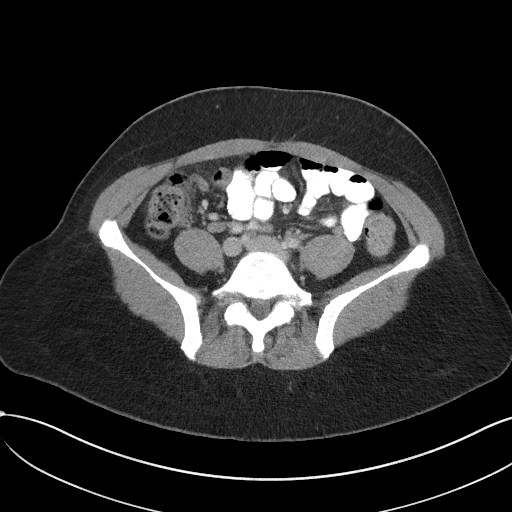
[im 44/94  soft-tissue]
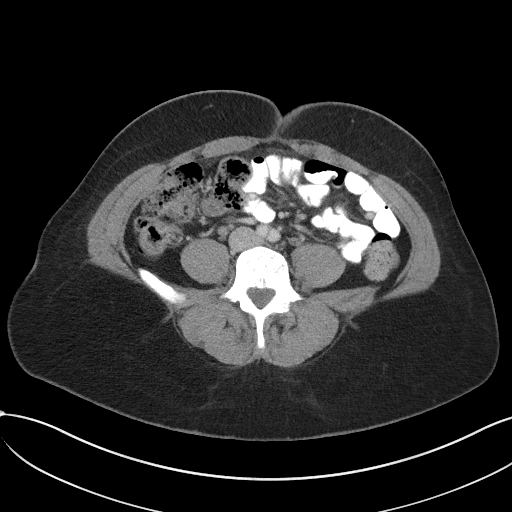
[im 50/94  soft-tissue]
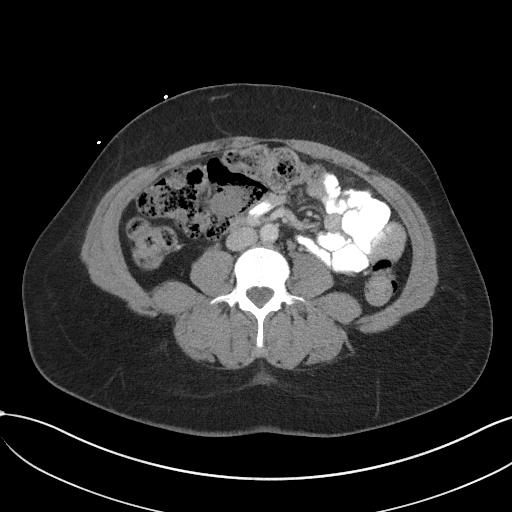
[im 56/94  soft-tissue]
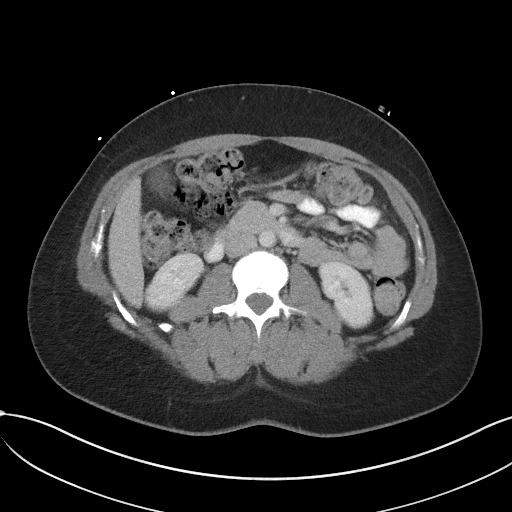
[im 63/94  soft-tissue]
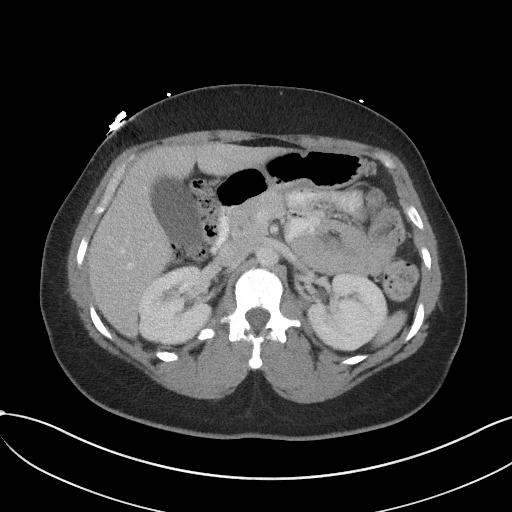
[im 63/94  bone]
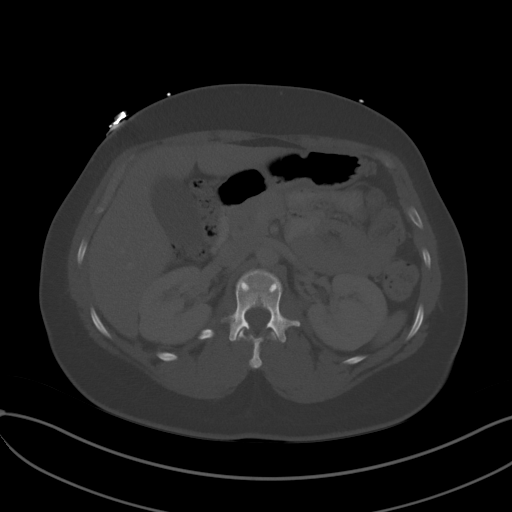
[im 75/94  soft-tissue]
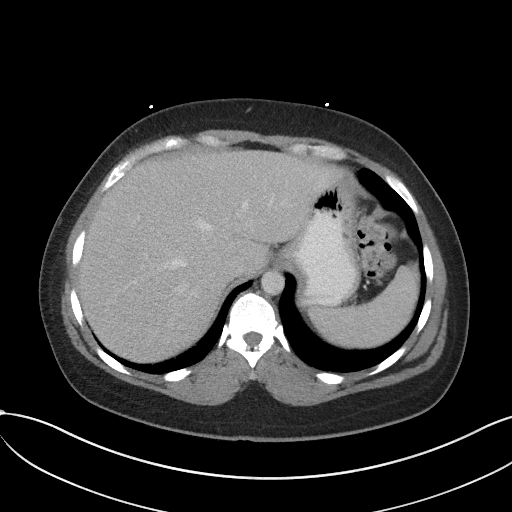
[im 81/94  soft-tissue]
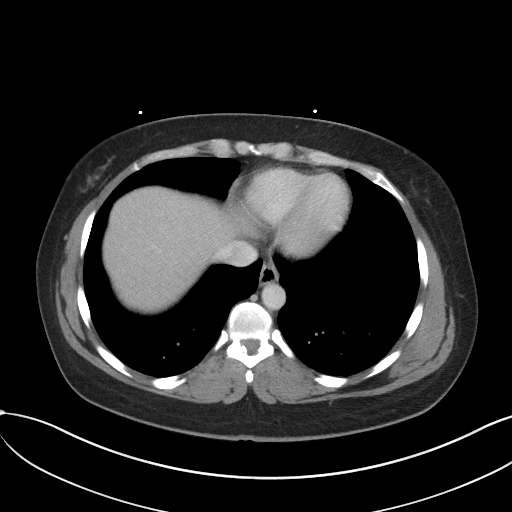
[im 87/94  soft-tissue]
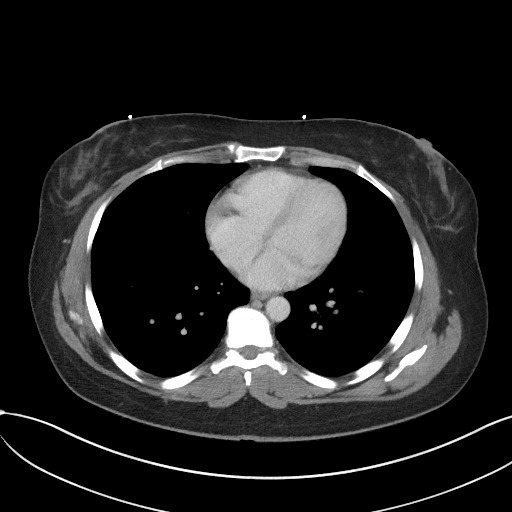

[Series 5: coronal st · coronal · 0.63mm/px · 3 of 84 slices shown]
[im 21/84  soft-tissue]
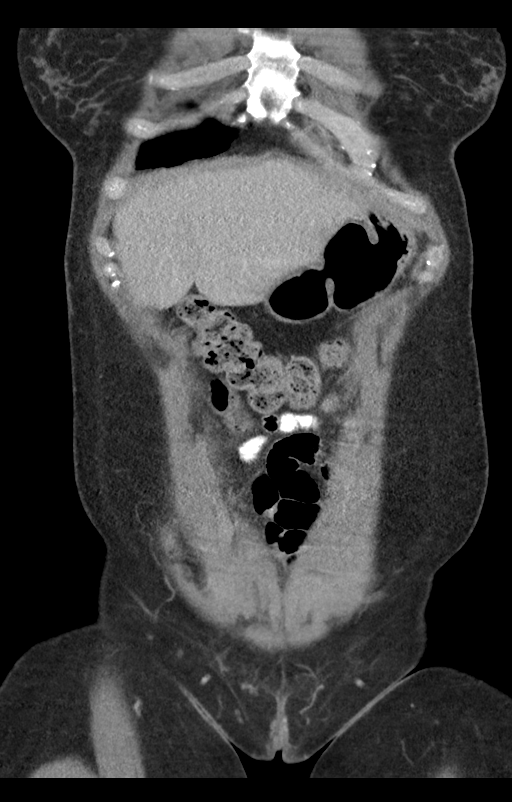
[im 42/84  soft-tissue]
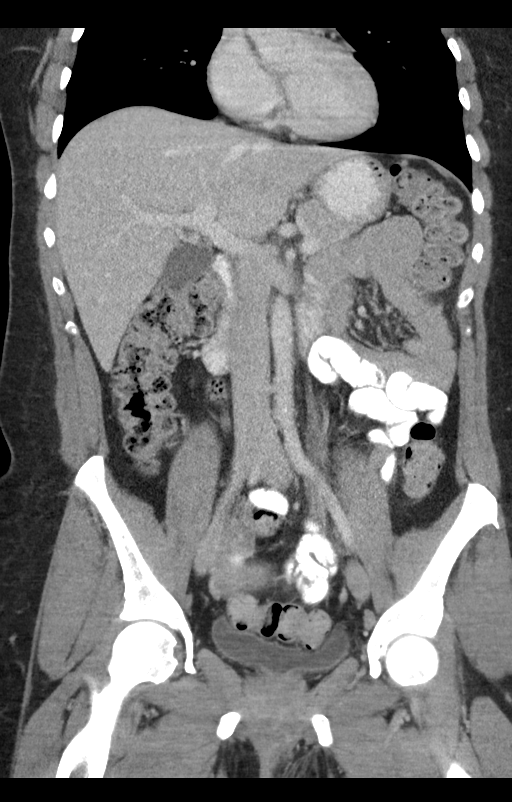
[im 63/84  soft-tissue]
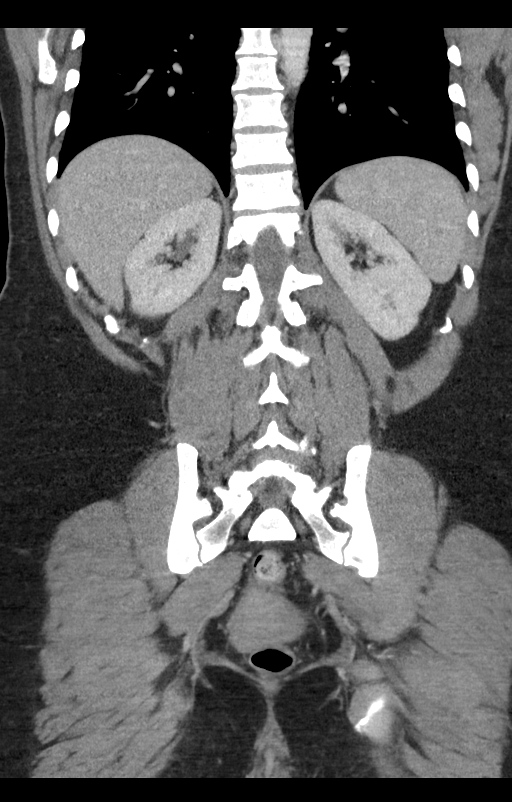

[15 of 46 positions shown; findings below may reference images not displayed]

FINDINGS: LOWER CHEST: Lung bases are clear. Included heart size is normal. No
pericardial effusion.

HEPATOBILIARY: Liver and gallbladder are normal.

PANCREAS: Normal.

SPLEEN: Normal.

ADRENALS/URINARY TRACT: Kidneys are orthotopic, demonstrating
symmetric enhancement. No nephrolithiasis, hydronephrosis or solid
renal masses. The unopacified ureters are normal in course and
caliber. Delayed imaging through the kidneys demonstrates symmetric
prompt contrast excretion within the proximal urinary collecting
system. Urinary bladder is partially distended and unremarkable.
Normal adrenal glands.

STOMACH/BOWEL: The stomach, small and large bowel are normal in
course and caliber without inflammatory changes. Moderate retained
large bowel stool. Normal appendix.

VASCULAR/LYMPHATIC: Aortoiliac vessels are normal in course and
caliber, trace calcific atherosclerosis. No lymphadenopathy by CT
size criteria.

REPRODUCTIVE: Normal.

OTHER: Small amount of free fluid in the pelvis is likely
physiologic. No intraperitoneal free air.

MUSCULOSKELETAL: LEFT perineal skin thickening extending to medial
thigh and labia with subcutaneous fat stranding and subcentimeter
bubble of gas. No focal fluid collection. 15 mm short axis LEFT
inguinal lymph node with inflammation. Osseous structures are
nonsuspicious.
IMPRESSION: LEFT perineal to labia and medial thigh cellulitis. No abscess;
single bubble of subcutaneous gas. No subfascial extent.

Mild reactive LEFT inguinal lymphadenopathy.

No acute intra-abdominal or pelvic process.

## 2017-04-09 DIAGNOSIS — R509 Fever, unspecified: Secondary | ICD-10-CM | POA: Diagnosis not present

## 2017-04-09 DIAGNOSIS — G5603 Carpal tunnel syndrome, bilateral upper limbs: Secondary | ICD-10-CM | POA: Diagnosis not present

## 2017-04-09 DIAGNOSIS — M7711 Lateral epicondylitis, right elbow: Secondary | ICD-10-CM | POA: Diagnosis not present

## 2017-04-09 DIAGNOSIS — B349 Viral infection, unspecified: Secondary | ICD-10-CM | POA: Diagnosis not present

## 2017-04-10 ENCOUNTER — Other Ambulatory Visit: Payer: Self-pay | Admitting: Orthopedic Surgery

## 2017-04-18 ENCOUNTER — Other Ambulatory Visit: Payer: Self-pay

## 2017-04-18 ENCOUNTER — Encounter (HOSPITAL_BASED_OUTPATIENT_CLINIC_OR_DEPARTMENT_OTHER): Payer: Self-pay | Admitting: *Deleted

## 2017-04-24 ENCOUNTER — Other Ambulatory Visit: Payer: Self-pay

## 2017-04-24 ENCOUNTER — Encounter (HOSPITAL_BASED_OUTPATIENT_CLINIC_OR_DEPARTMENT_OTHER)
Admission: RE | Disposition: A | Discharge status: Home / Self Care | Payer: Self-pay | Source: Ambulatory Visit | Attending: Orthopedic Surgery

## 2017-04-24 ENCOUNTER — Encounter (HOSPITAL_BASED_OUTPATIENT_CLINIC_OR_DEPARTMENT_OTHER): Payer: Self-pay

## 2017-04-24 ENCOUNTER — Ambulatory Visit (HOSPITAL_BASED_OUTPATIENT_CLINIC_OR_DEPARTMENT_OTHER): Payer: BLUE CROSS/BLUE SHIELD | Admitting: Anesthesiology

## 2017-04-24 ENCOUNTER — Ambulatory Visit (HOSPITAL_BASED_OUTPATIENT_CLINIC_OR_DEPARTMENT_OTHER)
Admission: RE | Admit: 2017-04-24 | Discharge: 2017-04-24 | Disposition: A | Discharge status: Home / Self Care | Payer: BLUE CROSS/BLUE SHIELD | Source: Ambulatory Visit | Attending: Orthopedic Surgery | Admitting: Orthopedic Surgery

## 2017-04-24 DIAGNOSIS — K219 Gastro-esophageal reflux disease without esophagitis: Secondary | ICD-10-CM | POA: Diagnosis not present

## 2017-04-24 DIAGNOSIS — M7711 Lateral epicondylitis, right elbow: Secondary | ICD-10-CM | POA: Diagnosis not present

## 2017-04-24 DIAGNOSIS — G5602 Carpal tunnel syndrome, left upper limb: Secondary | ICD-10-CM | POA: Diagnosis not present

## 2017-04-24 DIAGNOSIS — R200 Anesthesia of skin: Secondary | ICD-10-CM | POA: Diagnosis not present

## 2017-04-24 DIAGNOSIS — G5603 Carpal tunnel syndrome, bilateral upper limbs: Secondary | ICD-10-CM | POA: Diagnosis not present

## 2017-04-24 HISTORY — DX: Carpal tunnel syndrome, left upper limb: G56.02

## 2017-04-24 HISTORY — PX: CARPAL TUNNEL RELEASE: SHX101

## 2017-04-24 SURGERY — CARPAL TUNNEL RELEASE
Anesthesia: Monitor Anesthesia Care | Site: Wrist | Laterality: Left

## 2017-04-24 MED ORDER — FENTANYL CITRATE (PF) 100 MCG/2ML IJ SOLN
INTRAMUSCULAR | Status: AC
  Filled 2017-04-24: qty 2

## 2017-04-24 MED ORDER — BUPIVACAINE HCL (PF) 0.25 % IJ SOLN
INTRAMUSCULAR | Status: DC | PRN
  Administered 2017-04-24: 6.5 mL

## 2017-04-24 MED ORDER — 0.9 % SODIUM CHLORIDE (POUR BTL) OPTIME
TOPICAL | Status: DC | PRN
  Administered 2017-04-24: 100 mL

## 2017-04-24 MED ORDER — LIDOCAINE HCL (PF) 0.5 % IJ SOLN
INTRAMUSCULAR | Status: DC | PRN
  Administered 2017-04-24: 30 mL via INTRAVENOUS

## 2017-04-24 MED ORDER — HYDROCODONE-ACETAMINOPHEN 7.5-325 MG PO TABS
1.0000 | ORAL_TABLET | Freq: Once | ORAL | Status: AC | PRN
  Administered 2017-04-24: 1 via ORAL

## 2017-04-24 MED ORDER — SCOPOLAMINE 1 MG/3DAYS TD PT72: 1.0000 | MEDICATED_PATCH | Freq: Once | TRANSDERMAL | Status: DC | PRN

## 2017-04-24 MED ORDER — PROPOFOL 10 MG/ML IV BOLUS
INTRAVENOUS | Status: DC | PRN
  Administered 2017-04-24: 20 mg via INTRAVENOUS

## 2017-04-24 MED ORDER — CLINDAMYCIN PHOSPHATE 900 MG/50ML IV SOLN
900.0000 mg | INTRAVENOUS | Status: AC
  Administered 2017-04-24: 900 mg via INTRAVENOUS

## 2017-04-24 MED ORDER — BUPIVACAINE HCL (PF) 0.25 % IJ SOLN
INTRAMUSCULAR | Status: AC
  Filled 2017-04-24: qty 30

## 2017-04-24 MED ORDER — CLINDAMYCIN PHOSPHATE 900 MG/50ML IV SOLN
INTRAVENOUS | Status: AC
  Filled 2017-04-24: qty 50

## 2017-04-24 MED ORDER — MIDAZOLAM HCL 2 MG/2ML IJ SOLN
1.0000 mg | INTRAMUSCULAR | Status: DC | PRN
  Administered 2017-04-24: 2 mg via INTRAVENOUS

## 2017-04-24 MED ORDER — FENTANYL CITRATE (PF) 100 MCG/2ML IJ SOLN: 25.0000 ug | INTRAMUSCULAR | Status: DC | PRN

## 2017-04-24 MED ORDER — CHLORHEXIDINE GLUCONATE 4 % EX LIQD: 60.0000 mL | Freq: Once | CUTANEOUS | Status: DC

## 2017-04-24 MED ORDER — LACTATED RINGERS IV SOLN
INTRAVENOUS | Status: DC
  Administered 2017-04-24 (×2): via INTRAVENOUS

## 2017-04-24 MED ORDER — HYDROCODONE-ACETAMINOPHEN 5-325 MG PO TABS
ORAL_TABLET | ORAL | Status: AC
  Filled 2017-04-24: qty 1

## 2017-04-24 MED ORDER — MIDAZOLAM HCL 2 MG/2ML IJ SOLN
INTRAMUSCULAR | Status: AC
  Filled 2017-04-24: qty 2

## 2017-04-24 MED ORDER — FENTANYL CITRATE (PF) 100 MCG/2ML IJ SOLN
50.0000 ug | INTRAMUSCULAR | Status: DC | PRN
  Administered 2017-04-24: 100 ug via INTRAVENOUS

## 2017-04-24 MED ORDER — HYDROCODONE-ACETAMINOPHEN 5-325 MG PO TABS: 1.0000 | ORAL_TABLET | Freq: Four times a day (QID) | ORAL | 0 refills | Status: DC | PRN

## 2017-04-24 SURGICAL SUPPLY — 40 items
BLADE SURG 15 STRL LF DISP TIS (BLADE) ×1 IMPLANT
BLADE SURG 15 STRL SS (BLADE) ×3
BNDG CMPR 9X4 STRL LF SNTH (GAUZE/BANDAGES/DRESSINGS)
BNDG COHESIVE 3X5 TAN STRL LF (GAUZE/BANDAGES/DRESSINGS) ×3 IMPLANT
BNDG ESMARK 4X9 LF (GAUZE/BANDAGES/DRESSINGS) IMPLANT
BNDG GAUZE ELAST 4 BULKY (GAUZE/BANDAGES/DRESSINGS) ×3 IMPLANT
CHLORAPREP W/TINT 26ML (MISCELLANEOUS) ×3 IMPLANT
CORD BIPOLAR FORCEPS 12FT (ELECTRODE) ×3 IMPLANT
COVER BACK TABLE 60X90IN (DRAPES) ×3 IMPLANT
COVER MAYO STAND STRL (DRAPES) ×3 IMPLANT
CUFF TOURNIQUET SINGLE 18IN (TOURNIQUET CUFF) ×3 IMPLANT
DRAPE EXTREMITY T 121X128X90 (DRAPE) ×3 IMPLANT
DRAPE SURG 17X23 STRL (DRAPES) ×3 IMPLANT
DRSG PAD ABDOMINAL 8X10 ST (GAUZE/BANDAGES/DRESSINGS) ×3 IMPLANT
GAUZE SPONGE 4X4 12PLY STRL (GAUZE/BANDAGES/DRESSINGS) ×3 IMPLANT
GAUZE XEROFORM 1X8 LF (GAUZE/BANDAGES/DRESSINGS) ×3 IMPLANT
GLOVE BIO SURGEON STRL SZ 6.5 (GLOVE) ×1 IMPLANT
GLOVE BIO SURGEONS STRL SZ 6.5 (GLOVE) ×1
GLOVE BIOGEL PI IND STRL 7.0 (GLOVE) IMPLANT
GLOVE BIOGEL PI IND STRL 8.5 (GLOVE) ×1 IMPLANT
GLOVE BIOGEL PI INDICATOR 7.0 (GLOVE) ×4
GLOVE BIOGEL PI INDICATOR 8.5 (GLOVE) ×2
GLOVE SURG ORTHO 8.0 STRL STRW (GLOVE) ×3 IMPLANT
GLOVE SURG SS PI 6.5 STRL IVOR (GLOVE) ×2 IMPLANT
GLOVE SURG SS PI 7.0 STRL IVOR (GLOVE) ×2 IMPLANT
GOWN STRL REUS W/ TWL LRG LVL3 (GOWN DISPOSABLE) ×1 IMPLANT
GOWN STRL REUS W/ TWL XL LVL3 (GOWN DISPOSABLE) IMPLANT
GOWN STRL REUS W/TWL LRG LVL3 (GOWN DISPOSABLE) ×3
GOWN STRL REUS W/TWL XL LVL3 (GOWN DISPOSABLE) ×6 IMPLANT
NDL PRECISIONGLIDE 27X1.5 (NEEDLE) IMPLANT
NEEDLE PRECISIONGLIDE 27X1.5 (NEEDLE) ×3 IMPLANT
NS IRRIG 1000ML POUR BTL (IV SOLUTION) ×3 IMPLANT
PACK BASIN DAY SURGERY FS (CUSTOM PROCEDURE TRAY) ×3 IMPLANT
STOCKINETTE 4X48 STRL (DRAPES) ×3 IMPLANT
SUT ETHILON 4 0 PS 2 18 (SUTURE) ×3 IMPLANT
SUT VICRYL 4-0 PS2 18IN ABS (SUTURE) IMPLANT
SYR BULB 3OZ (MISCELLANEOUS) ×3 IMPLANT
SYR CONTROL 10ML LL (SYRINGE) ×2 IMPLANT
TOWEL OR 17X24 6PK STRL BLUE (TOWEL DISPOSABLE) ×3 IMPLANT
UNDERPAD 30X30 (UNDERPADS AND DIAPERS) ×3 IMPLANT

## 2017-04-24 NOTE — Op Note (Signed)
[name]:  [name], Amber            ACCOUNT NO.:  [id number]  MEDICAL RECORD NO.:  [id number]  LOCATION:                                 FACILITY:  PHYSICIAN:  Cindee SaltGary Andreia Gandolfi, M.D.            DATE OF BIRTH:  DATE OF PROCEDURE:  04/24/2017 DATE OF DISCHARGE:                              OPERATIVE REPORT   PREOPERATIVE DIAGNOSIS:  Carpal tunnel syndrome, left hand.  POSTOPERATIVE DIAGNOSIS:  Carpal tunnel syndrome, left hand.  OPERATION:  Decompression, left median nerve.  SURGEON:  Cindee SaltGary Colbi Staubs, MD.  ASSISTANT:  None.  ANESTHESIA:  Forearm IV regional with local infiltration, IV sedation.  [facility]  ANESTHESIOLOGIST:  Marcene Duosobert Fitzgerald.  HISTORY:  The patient is a 47 year old female with a history of numbness and tingling of her left hand.  Nerve conductions are positive for carpal tunnel syndrome.  This has not responded to conservative treatment.  She has elected to undergo surgical decompression to the median nerve.  Pre, peri, and postoperative courses have been discussed along with risks and complications.  She is aware that there is no guarantee to the surgery, the possibility of infection, recurrence of injury to arteries, nerves, tendons, incomplete relief of symptoms, and dystrophy, all discussed.  In the preoperative area, the patient is seen, the extremity marked by both patient and surgeon, and antibiotic given.  DESCRIPTION OF PROCEDURE:  The patient was brought to the operating room where a forearm-based IV regional anesthetic was carried out without difficulty.  She was prepped using ChloraPrep in a supine position with the left arm free.  A 3-minute dry time was allowed and time-out was taken confirming the patient and procedure.  A longitudinal incision was made in the left palm and carried down through subcutaneous tissue. Bleeders were electrocauterized with bipolar.  The palmar fascia was split.  The superficial palmar arch  was identified.  Flexor tendon to the ring and little finger was identified.  Through the ulnar side of the median nerve after retractors were placed protecting the median nerve radially and ulnar nerve ulnarly, the flexor retinaculum was incised with sharp dissection.  A right angle and Sewell retractor were placed between skin and forearm fascia.  The deep structures were dissected free with blunt dissection.  The proximal aspect of the flexor retinaculum, distal forearm fascia was then released under direct vision for approximately 2 cm proximal to the wrist crease.  The canal was explored.  An area of compression to the nerve was apparent.  Motor branch entered into muscle distally.  The wound was copiously irrigated with saline.  The skin was closed with interrupted 4-0 nylon sutures. Local infiltration with 0.25% bupivacaine without epinephrine was given. Approximately 8 mL was used.  A sterile compressive dressing with fingers free was applied.  On deflation of the tourniquet, all fingers immediately pinked.  She was taken to the recovery room for observation in satisfactory condition.  She will be discharged to home to [facility], on [place].          ______________________________ Cindee SaltGary Toyoko Silos, M.D.  GK/MEDQ  D:  04/24/2017  T:  04/24/2017  Job:  [id number]

## 2017-04-24 NOTE — Brief Op Note (Signed)
04/24/2017  11:58 AM  PATIENT:  Amber [name]  47 y.o. female  PRE-OPERATIVE DIAGNOSIS:  CARPAL TUNNEL SYNDROME LEFT  POST-OPERATIVE DIAGNOSIS:  CARPAL TUNNEL SYNDROME LEFT  PROCEDURE:  Procedure(s) with comments: LEFT CARPAL TUNNEL RELEASE (Left) - FAB  SURGEON:  Surgeon(s) and Role:    * Cindee SaltKuzma, Cailah Reach, MD - Primary  PHYSICIAN ASSISTANT:   ASSISTANTS: none   ANESTHESIA:   local, regional and IV sedation  EBL:  1 mL   BLOOD ADMINISTERED:none  DRAINS: none   LOCAL MEDICATIONS USED:  BUPIVICAINE   SPECIMEN:  No Specimen  DISPOSITION OF SPECIMEN:  N/A  COUNTS:  YES  TOURNIQUET:   Total Tourniquet Time Documented: Forearm (Left) - 21 minutes Total: Forearm (Left) - 21 minutes   DICTATION: .Other Dictation: Dictation Number [id number]  PLAN OF CARE: Discharge to home after PACU  PATIENT DISPOSITION:  PACU - hemodynamically stable.

## 2017-04-24 NOTE — Anesthesia Preprocedure Evaluation (Addendum)
Anesthesia Evaluation  Patient identified by MRN, date of birth, ID band Patient awake    Reviewed: Allergy & Precautions, NPO status , Patient's Chart, lab work & pertinent test results  Airway Mallampati: II  TM Distance: >3 FB Neck ROM: Full    Dental   Pulmonary neg pulmonary ROS,    breath sounds clear to auscultation       Cardiovascular negative cardio ROS   Rhythm:Regular Rate:Normal     Neuro/Psych  Neuromuscular disease    GI/Hepatic Neg liver ROS, GERD  Medicated,  Endo/Other  negative endocrine ROS  Renal/GU negative Renal ROS     Musculoskeletal   Abdominal   Peds  Hematology negative hematology ROS (+)   Anesthesia Other Findings   Reproductive/Obstetrics                            Anesthesia Physical Anesthesia Plan  ASA: II  Anesthesia Plan: Bier Block and MAC and Bier Block-LIDOCAINE ONLY   Post-op Pain Management:    Induction: Intravenous  PONV Risk Score and Plan: 2 and Ondansetron, Propofol infusion and Treatment may vary due to age or medical condition  Airway Management Planned: Simple Face Mask and Natural Airway  Additional Equipment:   Intra-op Plan:   Post-operative Plan:   Informed Consent: I have reviewed the patients History and Physical, chart, labs and discussed the procedure including the risks, benefits and alternatives for the proposed anesthesia with the patient or authorized representative who has indicated his/her understanding and acceptance.     Plan Discussed with:   Anesthesia Plan Comments:        Anesthesia Quick Evaluation

## 2017-04-24 NOTE — Anesthesia Postprocedure Evaluation (Signed)
Anesthesia Post Note  Patient: Amber [name]  Procedure(s) Performed: LEFT CARPAL TUNNEL RELEASE (Left Wrist)     Patient location during evaluation: PACU Anesthesia Type: MAC and Bier Block Level of consciousness: awake and alert Pain management: pain level controlled Vital Signs Assessment: post-procedure vital signs reviewed and stable Respiratory status: spontaneous breathing, nonlabored ventilation, respiratory function stable and patient connected to nasal cannula oxygen Cardiovascular status: stable and blood pressure returned to baseline Postop Assessment: no apparent nausea or vomiting Anesthetic complications: no    Last Vitals:  Vitals:   04/24/17 1230 04/24/17 1315  BP: 108/85 137/65  Pulse: 61 72  Resp: (!) 22 18  Temp:  36.7 C  SpO2: 97% 99%    Last Pain:  Vitals:   04/24/17 1300  TempSrc:   PainSc: 0-No pain                 Tiajuana Amass

## 2017-04-24 NOTE — Transfer of Care (Signed)
Immediate Anesthesia Transfer of Care Note  Patient: Amber [name]  Procedure(s) Performed: LEFT CARPAL TUNNEL RELEASE (Left Wrist)  Patient Location: PACU  Anesthesia Type:MAC and Bier block  Level of Consciousness: awake, alert , oriented and drowsy  Airway & Oxygen Therapy: Patient Spontanous Breathing and Patient connected to face mask oxygen  Post-op Assessment: Report given to RN and Post -op Vital signs reviewed and stable  Post vital signs: Reviewed and stable  Last Vitals:  Vitals Value Taken Time  BP 113/77 04/24/2017 12:02 PM  Temp    Pulse 63 04/24/2017 12:04 PM  Resp 19 04/24/2017 12:04 PM  SpO2 95 % 04/24/2017 12:04 PM  Vitals shown include unvalidated device data.  Last Pain:  Vitals:   04/24/17 1048  TempSrc: Oral      Patients Stated Pain Goal: 1 (50/53/97 6734)  Complications: No apparent anesthesia complications

## 2017-04-24 NOTE — Discharge Instructions (Addendum)

## 2017-04-24 NOTE — Op Note (Signed)
Other Dictation: Dictation Number [id number]
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
## 2017-04-25 ENCOUNTER — Encounter (HOSPITAL_BASED_OUTPATIENT_CLINIC_OR_DEPARTMENT_OTHER): Payer: Self-pay | Admitting: Orthopedic Surgery

## 2017-05-17 DIAGNOSIS — S60222A Contusion of left hand, initial encounter: Secondary | ICD-10-CM | POA: Diagnosis not present

## 2017-05-18 DIAGNOSIS — F900 Attention-deficit hyperactivity disorder, predominantly inattentive type: Secondary | ICD-10-CM | POA: Diagnosis not present

## 2017-06-06 DIAGNOSIS — R52 Pain, unspecified: Secondary | ICD-10-CM | POA: Diagnosis not present

## 2017-09-05 ENCOUNTER — Encounter: Payer: Self-pay | Admitting: Emergency Medicine

## 2017-09-05 ENCOUNTER — Ambulatory Visit
Admission: EM | Admit: 2017-09-05 | Discharge: 2017-09-05 | Disposition: A | Discharge status: Home / Self Care | Payer: BLUE CROSS/BLUE SHIELD | Attending: Family Medicine | Admitting: Family Medicine

## 2017-09-05 ENCOUNTER — Other Ambulatory Visit: Payer: Self-pay

## 2017-09-05 DIAGNOSIS — R300 Dysuria: Secondary | ICD-10-CM

## 2017-09-05 LAB — URINALYSIS, COMPLETE (UACMP) WITH MICROSCOPIC
BILIRUBIN URINE: NEGATIVE
Bacteria, UA: NONE SEEN
Glucose, UA: NEGATIVE mg/dL
HGB URINE DIPSTICK: NEGATIVE
KETONES UR: NEGATIVE mg/dL
LEUKOCYTES UA: NEGATIVE
NITRITE: POSITIVE — AB
Protein, ur: NEGATIVE mg/dL
RBC / HPF: NONE SEEN RBC/hpf (ref 0–5)
SPECIFIC GRAVITY, URINE: 1.02 (ref 1.005–1.030)
WBC UA: NONE SEEN WBC/hpf (ref 0–5)
pH: 5 (ref 5.0–8.0)

## 2017-09-05 MED ORDER — SULFAMETHOXAZOLE-TRIMETHOPRIM 800-160 MG PO TABS: 1.0000 | ORAL_TABLET | Freq: Two times a day (BID) | ORAL | 0 refills | Status: AC

## 2017-09-05 NOTE — Discharge Instructions (Signed)
Take medication as prescribed. Rest. Drink plenty of fluids.  ° °Follow up with your primary care physician this week as needed. Return to Urgent care for new or worsening concerns.  ° °

## 2017-09-05 NOTE — ED Provider Notes (Signed)
[facility] ____________________________________________  Time seen: Approximately 12:07 PM  I have reviewed the triage vital signs and the nursing notes.   HISTORY  Chief Complaint Urinary Frequency and Dysuria  HPI Amber [name] is a 47 y.o. female presenting for evaluation of 2 days of urinary frequency, urinary urgency and onset of burning with urination today.  States has had some dull low back pain as well, denies any unilateral flank pain.  No fevers.  Mild suprapubic pressure.  Denies any vaginal discharge or vaginal discomfort.  States that she has not been drinking as much water as normal and feels that this may have been a trigger.  Denies other aggravating or alleviating factors.  Reports otherwise feels well.  Denies other complaints.  No recent antibiotic use.    Past Medical History:  Diagnosis Date  . Carpal tunnel syndrome of left wrist   . Seasonal allergies   . TMJ disease     Patient Active Problem List   Diagnosis Date Noted  . Cellulitis 03/08/2016    Past Surgical History:  Procedure Laterality Date  . CARPAL TUNNEL RELEASE Left 04/24/2017   Procedure: LEFT CARPAL TUNNEL RELEASE;  Surgeon: Cindee SaltKuzma, Gary, MD;  Location: [facility];  Service: Orthopedics;  Laterality: Left;  FAB  . cecerian section     x 2  . CESAREAN SECTION       No current facility-administered medications for this encounter.   Current Outpatient Medications:  .  esomeprazole (NEXIUM) 20 MG capsule, Take 20 mg by mouth daily at 12 noon., Disp: , Rfl:  .  sulfamethoxazole-trimethoprim (BACTRIM DS,SEPTRA DS) 800-160 MG tablet, Take 1 tablet by mouth 2 (two) times daily for 5 days., Disp: 10 tablet, Rfl: 0  Allergies Diflucan [fluconazole] and Keflex [cephalexin]  Family History  Problem Relation Age of Onset  . Hypertension Other   . Dementia Mother     Social History Social History   Tobacco Use  . Smoking status: Never Smoker  .  Smokeless tobacco: Never Used  Substance Use Topics  . Alcohol use: No  . Drug use: No    Review of Systems Constitutional: No fever/chills Cardiovascular: Denies chest pain. Respiratory: Denies shortness of breath. Gastrointestinal: No abdominal pain.  No nausea, no vomiting.  No diarrhea. Genitourinary: positive for dysuria. Musculoskeletal: positive for back pain. Skin: Negative for rash.   ____________________________________________   PHYSICAL EXAM:  VITAL SIGNS: ED Triage Vitals  Enc Vitals Group     BP 09/05/17 1141 105/71     Pulse Rate 09/05/17 1141 73     Resp 09/05/17 1141 18     Temp 09/05/17 1141 99 F (37.2 C)     Temp Source 09/05/17 1141 Oral     SpO2 09/05/17 1141 99 %     Weight 09/05/17 1138 189 lb (85.7 kg)     Height 09/05/17 1138 5\' 5"  (1.651 m)     Head Circumference --      Peak Flow --      Pain Score 09/05/17 1137 3     Pain Loc --      Pain Edu? --      Excl. in GC? --     Constitutional: Alert and oriented. Well appearing and in no acute distress. ENT      Head: Normocephalic and atraumatic. Cardiovascular: Normal rate, regular rhythm. Grossly normal heart sounds.  Good peripheral circulation. Respiratory: Normal respiratory effort without tachypnea nor retractions. Breath sounds are clear and  equal bilaterally. No wheezes, rales, rhonchi. Gastrointestinal: Soft and nontender.No CVA tenderness. Musculoskeletal: No midline cervical, thoracic or lumbar tenderness to palpation.  Neurologic:  Normal speech and language. Speech is normal. No gait instability.  Skin:  Skin is warm, dry Psychiatric: Mood and affect are normal. Speech and behavior are normal. Patient exhibits appropriate insight and judgment   ___________________________________________   LABS (all labs ordered are listed, but only abnormal results are displayed)  Labs Reviewed  URINALYSIS, COMPLETE (UACMP) WITH MICROSCOPIC - Abnormal; Notable for the following components:       Result Value   Nitrite POSITIVE (*)    All other components within normal limits   ____________________________________________  PROCEDURES Procedures   INITIAL IMPRESSION / ASSESSMENT AND PLAN / ED COURSE  Pertinent labs & imaging results that were available during my care of the patient were reviewed by me and considered in my medical decision making (see chart for details).  Well-appearing patient.  No acute distress.  Suspect UTI.  Urinalysis reviewed, negative except for positive nitrite.  Discussed evaluation of urine culture, patient expressed does not currently have insurance and declines urine culture.  Will treat with 5-day course of Bactrim.  Encourage rest, fluids, supportive care and reevaluation with culture if symptoms continue.Discussed indication, risks and benefits of medications with patient.  Discussed follow up with Primary care physician this week. Discussed follow up and return parameters including no resolution or any worsening concerns. Patient verbalized understanding and agreed to plan.   ____________________________________________   FINAL CLINICAL IMPRESSION(S) / ED DIAGNOSES  Final diagnoses:  Dysuria     ED Discharge Orders         Ordered    sulfamethoxazole-trimethoprim (BACTRIM DS,SEPTRA DS) 800-160 MG tablet  2 times daily     09/05/17 1221           Note: This dictation was prepared with Dragon dictation along with smaller phrase technology. Any transcriptional errors that result from this process are unintentional.         Renford Dills, NP 09/05/17 1343

## 2017-09-05 NOTE — ED Triage Notes (Signed)
Patient c/o dysuria and urinary frequency that started 2 days ago.  

## 2017-09-07 ENCOUNTER — Telehealth: Payer: Self-pay | Admitting: Emergency Medicine

## 2017-09-07 DIAGNOSIS — R300 Dysuria: Secondary | ICD-10-CM

## 2017-09-07 MED ORDER — NITROFURANTOIN MONOHYD MACRO 100 MG PO CAPS: 100.0000 mg | ORAL_CAPSULE | Freq: Two times a day (BID) | ORAL | 0 refills | Status: DC

## 2017-09-07 NOTE — Telephone Encounter (Signed)
Pt called stating that she has a rash on her face, and arms that was a fine red rash. She had a similar rash with Keflex. Rash started 1 day ago after her 3rd or 4th tablet. Pt urinary sx are improving. New rx sent for Macrobid 100 mg BID # 10 no RF per Lillia AbedLindsay, NP

## 2017-10-11 ENCOUNTER — Encounter: Payer: Self-pay | Admitting: Emergency Medicine

## 2017-10-11 ENCOUNTER — Ambulatory Visit
Admission: EM | Admit: 2017-10-11 | Discharge: 2017-10-11 | Disposition: A | Discharge status: Home / Self Care | Payer: Self-pay | Attending: Family Medicine | Admitting: Family Medicine

## 2017-10-11 ENCOUNTER — Other Ambulatory Visit: Payer: Self-pay

## 2017-10-11 DIAGNOSIS — B029 Zoster without complications: Secondary | ICD-10-CM

## 2017-10-11 MED ORDER — VALACYCLOVIR HCL 1 G PO TABS: 1000.0000 mg | ORAL_TABLET | Freq: Three times a day (TID) | ORAL | 0 refills | Status: AC

## 2017-10-11 MED ORDER — TRAMADOL HCL 50 MG PO TABS: 50.0000 mg | ORAL_TABLET | Freq: Three times a day (TID) | ORAL | 0 refills | Status: DC | PRN

## 2017-10-11 MED ORDER — HYDROCODONE-ACETAMINOPHEN 5-325 MG PO TABS: 1.0000 | ORAL_TABLET | Freq: Three times a day (TID) | ORAL | 0 refills | Status: DC | PRN

## 2017-10-11 NOTE — Discharge Instructions (Signed)
Medications as prescribed - Valtrex three times daily. Use the pain medication as needed.  Take care  Dr. Adriana Simas

## 2017-10-11 NOTE — ED Triage Notes (Signed)
Paitent states that she woke up yesterday morning with a burning rash on her left side of her back.  Patient also reports HAs.

## 2017-10-11 NOTE — ED Provider Notes (Addendum)
[facility]    CSN: [id number] Arrival date & time: 10/11/17  0830  History   Chief Complaint Chief Complaint  Patient presents with  . Back Pain  . Headache  . Rash   HPI  47 year old female presents with rash and associated pain.  Started Tuesday.  Patient reports severe pain.  Her rash is located on the left lower back and goes around anteriorly.  Raised, red.  Patient states that she is been under a great deal of stress.  No fever.  No chills.  Patient does endorse headache.  She has tried over-the-counter anti-inflammatories without resolution.  Symptoms are severe.  No other associated symptoms.  No other complaints.   PMH, Surgical Hx, Family Hx, Social History reviewed and updated as below.  Past Medical History:  Diagnosis Date  . Carpal tunnel syndrome of left wrist   . Seasonal allergies   . TMJ disease    Patient Active Problem List   Diagnosis Date Noted  . Cellulitis 03/08/2016   Past Surgical History:  Procedure Laterality Date  . CARPAL TUNNEL RELEASE Left 04/24/2017   Procedure: LEFT CARPAL TUNNEL RELEASE;  Surgeon: Amber Salt, MD;  Location: [facility];  Service: Orthopedics;  Laterality: Left;  FAB  . cecerian section     x 2  . CESAREAN SECTION     OB History   None    Home Medications    Prior to Admission medications   Medication Sig Start Date End Date Taking? Authorizing Provider  amphetamine-dextroamphetamine (ADDERALL) 20 MG tablet  06/06/16  Yes [provider]  esomeprazole (NEXIUM) 20 MG capsule Take 20 mg by mouth daily at 12 noon.   Yes [provider]  traMADol (ULTRAM) 50 MG tablet Take 1 tablet (50 mg total) by mouth every 8 (eight) hours as needed. 10/11/17   Amber Sams, DO  valACYclovir (VALTREX) 1000 MG tablet Take 1 tablet (1,000 mg total) by mouth 3 (three) times daily. 10/11/17   Amber Sams, DO    Family History Family History  Problem Relation Age of Onset  . Hypertension  Other   . Dementia Mother     Social History Social History   Tobacco Use  . Smoking status: Never Smoker  . Smokeless tobacco: Never Used  Substance Use Topics  . Alcohol use: No  . Drug use: No     Allergies   Diflucan [fluconazole] and Keflex [cephalexin]   Review of Systems Review of Systems  Musculoskeletal:       Pain.   Skin: Positive for rash.  Neurological: Positive for headaches.   Physical Exam Triage Vital Signs ED Triage Vitals  Enc Vitals Group     BP 10/11/17 0844 114/82     Pulse Rate 10/11/17 0844 70     Resp 10/11/17 0844 14     Temp 10/11/17 0844 98.8 F (37.1 C)     Temp Source 10/11/17 0844 Oral     SpO2 10/11/17 0844 99 %     Weight 10/11/17 0840 190 lb (86.2 kg)     Height 10/11/17 0840 5' 5.5" (1.664 m)     Head Circumference --      Peak Flow --      Pain Score 10/11/17 0839 8     Pain Loc --      Pain Edu? --      Excl. in GC? --    Updated Vital Signs BP 114/82 (BP Location: Left Arm)  Pulse 70   Temp 98.8 F (37.1 C) (Oral)   Resp 14   Ht 5' 5.5" (1.664 m)   Wt 86.2 kg   LMP 10/11/2017 (Exact Date)   SpO2 99%   BMI 31.14 kg/m   Visual Acuity Right Eye Distance:   Left Eye Distance:   Bilateral Distance:    Right Eye Near:   Left Eye Near:    Bilateral Near:     Physical Exam  Constitutional: She is oriented to person, place, and time. She appears well-developed. No distress.  HENT:  Head: Normocephalic and atraumatic.  Pulmonary/Chest: Effort normal. No respiratory distress.  Neurological: She is alert and oriented to person, place, and time.  Skin:     Erythematous, vesicular rash noted at the labeled location.  Tender to palpation.  Psychiatric: She has a normal mood and affect. Her behavior is normal.  Nursing note and vitals reviewed.  UC Treatments / Results  Labs (all labs ordered are listed, but only abnormal results are displayed) Labs Reviewed - No data to display  EKG None  Radiology No  results found.  Procedures Procedures (including critical care time)  Medications Ordered in UC Medications - No data to display  Initial Impression / Assessment and Plan / UC Course  I have reviewed the triage vital signs and the nursing notes.  Pertinent labs & imaging results that were available during my care of the patient were reviewed by me and considered in my medical decision making (see chart for details).    47 year old female presents with herpes zoster.  Treated with Valtrex. Tramadol as needed for pain not relieved by NSAID's.  ** After leaving patient reported that the Tramadol does not work for her and requested another medication.  No chronic controlled substance database reviewed.  No concerns for opiate abuse.  Sending in Vicodin (#5) for severe pain.   Final Clinical Impressions(s) / UC Diagnoses   Final diagnoses:  Herpes zoster without complication     Discharge Instructions     Medications as prescribed - Valtrex three times daily. Use the pain medication as needed.  Take care  Dr. Adriana Simas    ED Prescriptions    Medication Sig Dispense Auth. Provider   valACYclovir (VALTREX) 1000 MG tablet Take 1 tablet (1,000 mg total) by mouth 3 (three) times daily. 21 tablet Amber Boomer G, DO   traMADol (ULTRAM) 50 MG tablet Take 1 tablet (50 mg total) by mouth every 8 (eight) hours as needed. 15 tablet Amber Sams, DO     Controlled Substance Prescriptions [place] Controlled Substance Registry consulted? Yes, I have consulted the [place] Controlled Substances Registry for this patient, and feel the risk/benefit ratio today is favorable for proceeding with this prescription for a controlled substance.   Amber Sams, DO 10/11/17 1191    Amber Sams, DO 10/11/17 [phone]

## 2018-01-07 ENCOUNTER — Emergency Department
Admission: EM | Admit: 2018-01-07 | Discharge: 2018-01-07 | Disposition: A | Discharge status: Home / Self Care | Payer: Self-pay | Attending: Emergency Medicine | Admitting: Emergency Medicine

## 2018-01-07 ENCOUNTER — Other Ambulatory Visit: Payer: Self-pay

## 2018-01-07 DIAGNOSIS — M545 Low back pain: Secondary | ICD-10-CM | POA: Insufficient documentation

## 2018-01-07 DIAGNOSIS — G8929 Other chronic pain: Secondary | ICD-10-CM | POA: Insufficient documentation

## 2018-01-07 DIAGNOSIS — Z79899 Other long term (current) drug therapy: Secondary | ICD-10-CM | POA: Insufficient documentation

## 2018-01-07 MED ORDER — KETOROLAC TROMETHAMINE 30 MG/ML IJ SOLN
15.0000 mg | Freq: Once | INTRAMUSCULAR | Status: AC
  Administered 2018-01-07: 15 mg via INTRAMUSCULAR
  Filled 2018-01-07: qty 1

## 2018-01-07 MED ORDER — PREDNISONE 20 MG PO TABS: 60.0000 mg | ORAL_TABLET | Freq: Every day | ORAL | 0 refills | Status: AC

## 2018-01-07 MED ORDER — LIDOCAINE 5 % EX PTCH: 1.0000 | MEDICATED_PATCH | Freq: Two times a day (BID) | CUTANEOUS | 0 refills | Status: AC

## 2018-01-07 MED ORDER — CYCLOBENZAPRINE HCL 5 MG PO TABS: 5.0000 mg | ORAL_TABLET | Freq: Three times a day (TID) | ORAL | 0 refills | Status: AC | PRN

## 2018-01-07 NOTE — ED Notes (Addendum)
Pt reports left lower back since October 29th when she injured it moving a pt at her job; was seen at a local urgent care 2 days later; xrays were negative; pt says she's been seeing a chiropractor with minimal relief; was to go for physical therapy but it has never been scheduled; pt taking diclofenac, tramadol, and methocarbamol but has run out of tramadol; pt says when she takes the medications she feels like a zombie; pt says pain is keeping her from sleep now; pain currently 4/10 but pain gets worse with certain movement and then it's very hard to get under control;

## 2018-01-07 NOTE — ED Triage Notes (Signed)
Reports lower back pain since she hurt it in October.

## 2018-01-07 NOTE — ED Provider Notes (Addendum)
[facility] Emergency Department Provider Note  ____________________________________________   First MD Initiated Contact with Patient 01/07/18 [phone]     (approximate)  I have reviewed the triage vital signs and the nursing notes.   HISTORY  Chief Complaint Back Pain    HPI Amber [name] is a 47 y.o. female who presents for evaluation of chronic left-sided low back pain.  The patient has had pain since the end of October, approximately 2 months ago.  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  She reports that she has been to a chiropractor as well as an urgent care multiple times and had negative x-rays and has been on a lot of medications including methocarbamol and diclofenac but nothing seems to be helping.  She states that the pain is no worse tonight, but it it is severe, persistent for 2 months, and she says that she wants to know what is going on.  She denies any urinary retention or urinary incontinence.  She always has some degree of mild constipation but says this is no different than usual.  She denies IV drug use.  She denies fever/chills, chest pain, shortness of breath, nausea, vomiting, and abdominal pain.  Moving around make it worse and the medication she has been taking does make it better but she does not like "feeling like a zombie".  Past Medical History:  Diagnosis Date  . Carpal tunnel syndrome of left wrist   . Seasonal allergies   . TMJ disease     Patient Active Problem List   Diagnosis Date Noted  . Cellulitis 03/08/2016    Past Surgical History:  Procedure Laterality Date  . CARPAL TUNNEL RELEASE Left 04/24/2017   Procedure: LEFT CARPAL TUNNEL RELEASE;  Surgeon: Cindee SaltKuzma, Gary, MD;  Location: [facility];  Service: Orthopedics;  Laterality: Left;  FAB  . cecerian section     x 2  . CESAREAN SECTION      Prior to Admission medications     Medication Sig Start Date End Date Taking? Authorizing Provider  amphetamine-dextroamphetamine (ADDERALL) 20 MG tablet  06/06/16   [provider]  cyclobenzaprine (FLEXERIL) 5 MG tablet Take 1 tablet (5 mg total) by mouth 3 (three) times daily as needed for muscle spasms. 01/07/18   [name], Demetreus Lothamer, MD  esomeprazole (NEXIUM) 20 MG capsule Take 20 mg by mouth daily at 12 noon.    [provider]  HYDROcodone-acetaminophen (NORCO/VICODIN) 5-325 MG tablet Take 1 tablet by mouth every 8 (eight) hours as needed for moderate pain. 10/11/17   Tommie Samsook, Jayce G, DO  lidocaine (LIDODERM) 5 % Place 1 patch onto the skin every 12 (twelve) hours. Remove & Discard patch within 12 hours or as directed by MD.  Wynelle FannyLeave the patch off for 12 hours before applying a new one. 01/07/18 01/07/19  [name], Kveon Casanas, MD  predniSONE (DELTASONE) 20 MG tablet Take 3 tablets (60 mg total) by mouth daily. 01/07/18   [name], Nataleigh Griffin, MD  valACYclovir (VALTREX) 1000 MG tablet Take 1 tablet (1,000 mg total) by mouth 3 (three) times daily. 10/11/17   Tommie Samsook, Jayce G, DO    Allergies Diflucan [fluconazole] and Keflex [cephalexin]  Family History  Problem Relation Age of Onset  . Hypertension Other   . Dementia Mother     Social History Social History   Tobacco Use  . Smoking status: Never Smoker  .  Smokeless tobacco: Never Used  Substance Use Topics  . Alcohol use: No  . Drug use: No    Review of Systems Constitutional: No fever/chills Eyes: No visual changes. Cardiovascular: Denies chest pain. Respiratory: Denies shortness of breath. Gastrointestinal: No abdominal pain.  No nausea, no vomiting.  Mild chronic constipation. Genitourinary: Negative for dysuria.  No urinary retention nor urinary incontinence. Musculoskeletal: Left-sided nonradiating low back pain as described above Integumentary: Negative for rash. Neurological: Negative for headaches, focal weakness or  numbness.   ____________________________________________   PHYSICAL EXAM:  VITAL SIGNS: ED Triage Vitals  Enc Vitals Group     BP 01/07/18 0107 119/74     Pulse Rate 01/07/18 0107 68     Resp 01/07/18 0107 18     Temp 01/07/18 0107 98.5 F (36.9 C)     Temp Source 01/07/18 0107 Oral     SpO2 01/07/18 0107 98 %     Weight 01/07/18 0104 84.4 kg (186 lb)     Height 01/07/18 0104 1.664 m (5' 5.5")     Head Circumference --      Peak Flow --      Pain Score 01/07/18 0104 6     Pain Loc --      Pain Edu? --      Excl. in GC? --     Constitutional: Alert and oriented. Well appearing and in no acute distress.  Occasionally does appear uncomfortable when she is moving around Eyes: Conjunctivae are normal.  Head: Atraumatic. Mouth/Throat: Mucous membranes are moist. Cardiovascular: Normal rate, regular rhythm. Good peripheral circulation. Respiratory: Normal respiratory effort.  No retractions.  Musculoskeletal: No lower extremity tenderness nor edema. No gross deformities of extremities.  Tenderness to palpation of the paraspinal muscles in the lower lumbar region to the left of the spine.  No induration, no erythema, no palpable deformities of the spine itself.  The pain and tenderness are nonradiating and she has no evidence of sciatica.  She is ambulatory although she does so cautiously and hesitantly. Neurologic:  Normal speech and language. No gross focal neurologic deficits are appreciated.  Skin:  Skin is warm, dry and intact. No rash noted. Psychiatric: Mood and affect are normal. Speech and behavior are normal.  ____________________________________________   LABS (all labs ordered are listed, but only abnormal results are displayed)  Labs Reviewed - No data to display ____________________________________________  EKG  No indication for EKG     ____________________________________________  RADIOLOGY   ED MD interpretation: No indication for  imaging  Official radiology report(s): No results found.  ____________________________________________   PROCEDURES  Critical Care performed: No   Procedure(s) performed:   Procedures   ____________________________________________   INITIAL IMPRESSION / ASSESSMENT AND PLAN / ED COURSE  As part of my medical decision making, I reviewed the following data within the electronic MEDICAL RECORD NUMBER Nursing notes reviewed and incorporated, Old chart reviewed, Notes from prior ED visits and [place] Controlled Substance Database    The patient has no signs or symptoms of acute or emergent back pain.  She has normal and stable vital signs and has been having symptoms for about 2 months.  No indication for emergent MRI or surgical intervention.  No indication of epidural abscess, osteomyelitis, transverse myelitis, etc.  I explained to the patient that I will not be able to fix the problem in the emergency department and I encouraged her to follow-up with Dr. Yves Dill or an orthopedic surgeon for additional management recommendations.  I  encouraged her to stop taking the medicine she is currently using and instead take the medications I am prescribing, most notably a Lidoderm patch, Flexeril, and over-the-counter ibuprofen, as well as a short course of prednisone.  She states she understands.  I gave my usual and customary return precautions.  The patient was given Toradol 15 mg intramuscular while in the emergency department.     ____________________________________________  FINAL CLINICAL IMPRESSION(S) / ED DIAGNOSES  Final diagnoses:  Chronic left-sided low back pain without sciatica     MEDICATIONS GIVEN DURING THIS VISIT:  Medications  ketorolac (TORADOL) 30 MG/ML injection 15 mg (15 mg Intramuscular Given 01/07/18 0448)     ED Discharge Orders         Ordered    predniSONE (DELTASONE) 20 MG tablet  Daily     01/07/18 0528    cyclobenzaprine (FLEXERIL) 5 MG tablet  3 times daily  PRN     01/07/18 0528    lidocaine (LIDODERM) 5 %  Every 12 hours     01/07/18 0528           Note:  This document was prepared using Dragon voice recognition software and may include unintentional dictation errors.    [name], Delania Ferg, MD 01/07/18 [id number]    [name], Lometa Riggin, MD 01/07/18 0600

## 2018-01-07 NOTE — Discharge Instructions (Signed)

## 2018-08-29 DIAGNOSIS — Z86718 Personal history of other venous thrombosis and embolism: Secondary | ICD-10-CM | POA: Diagnosis not present

## 2018-08-29 DIAGNOSIS — R1310 Dysphagia, unspecified: Secondary | ICD-10-CM | POA: Diagnosis not present

## 2018-08-29 DIAGNOSIS — F909 Attention-deficit hyperactivity disorder, unspecified type: Secondary | ICD-10-CM | POA: Diagnosis not present

## 2018-08-29 DIAGNOSIS — E663 Overweight: Secondary | ICD-10-CM | POA: Diagnosis not present

## 2018-09-13 DIAGNOSIS — R1310 Dysphagia, unspecified: Secondary | ICD-10-CM | POA: Diagnosis not present

## 2018-09-17 DIAGNOSIS — Z86718 Personal history of other venous thrombosis and embolism: Secondary | ICD-10-CM | POA: Diagnosis not present

## 2018-09-17 DIAGNOSIS — Z1329 Encounter for screening for other suspected endocrine disorder: Secondary | ICD-10-CM | POA: Diagnosis not present

## 2018-09-17 DIAGNOSIS — R0602 Shortness of breath: Secondary | ICD-10-CM | POA: Diagnosis not present

## 2018-09-19 DIAGNOSIS — Z23 Encounter for immunization: Secondary | ICD-10-CM | POA: Diagnosis not present

## 2018-09-19 DIAGNOSIS — E669 Obesity, unspecified: Secondary | ICD-10-CM | POA: Diagnosis not present

## 2018-09-19 DIAGNOSIS — E663 Overweight: Secondary | ICD-10-CM | POA: Diagnosis not present

## 2018-09-19 DIAGNOSIS — Z780 Asymptomatic menopausal state: Secondary | ICD-10-CM | POA: Diagnosis not present

## 2018-09-19 DIAGNOSIS — N393 Stress incontinence (female) (male): Secondary | ICD-10-CM | POA: Diagnosis not present

## 2018-11-18 DIAGNOSIS — Z1211 Encounter for screening for malignant neoplasm of colon: Secondary | ICD-10-CM | POA: Diagnosis not present

## 2018-11-18 DIAGNOSIS — Z538 Procedure and treatment not carried out for other reasons: Secondary | ICD-10-CM | POA: Diagnosis not present

## 2018-12-09 DIAGNOSIS — K219 Gastro-esophageal reflux disease without esophagitis: Secondary | ICD-10-CM | POA: Diagnosis not present

## 2018-12-09 DIAGNOSIS — R7989 Other specified abnormal findings of blood chemistry: Secondary | ICD-10-CM | POA: Diagnosis not present

## 2018-12-09 DIAGNOSIS — E538 Deficiency of other specified B group vitamins: Secondary | ICD-10-CM | POA: Diagnosis not present

## 2018-12-09 DIAGNOSIS — E669 Obesity, unspecified: Secondary | ICD-10-CM | POA: Diagnosis not present

## 2018-12-09 DIAGNOSIS — R87619 Unspecified abnormal cytological findings in specimens from cervix uteri: Secondary | ICD-10-CM | POA: Diagnosis not present

## 2018-12-09 DIAGNOSIS — E539 Vitamin B deficiency, unspecified: Secondary | ICD-10-CM | POA: Diagnosis not present

## 2018-12-09 DIAGNOSIS — E559 Vitamin D deficiency, unspecified: Secondary | ICD-10-CM | POA: Diagnosis not present

## 2018-12-16 DIAGNOSIS — Z1151 Encounter for screening for human papillomavirus (HPV): Secondary | ICD-10-CM | POA: Diagnosis not present

## 2018-12-16 DIAGNOSIS — Z01419 Encounter for gynecological examination (general) (routine) without abnormal findings: Secondary | ICD-10-CM | POA: Diagnosis not present

## 2018-12-16 DIAGNOSIS — N760 Acute vaginitis: Secondary | ICD-10-CM | POA: Diagnosis not present

## 2018-12-16 DIAGNOSIS — Z2821 Immunization not carried out because of patient refusal: Secondary | ICD-10-CM | POA: Diagnosis not present

## 2018-12-16 DIAGNOSIS — Z113 Encounter for screening for infections with a predominantly sexual mode of transmission: Secondary | ICD-10-CM | POA: Diagnosis not present

## 2018-12-16 DIAGNOSIS — E669 Obesity, unspecified: Secondary | ICD-10-CM | POA: Insufficient documentation

## 2018-12-16 DIAGNOSIS — B9689 Other specified bacterial agents as the cause of diseases classified elsewhere: Secondary | ICD-10-CM | POA: Diagnosis not present

## 2019-03-10 ENCOUNTER — Encounter: Payer: Self-pay | Admitting: Internal Medicine

## 2019-10-08 ENCOUNTER — Ambulatory Visit (INDEPENDENT_AMBULATORY_CARE_PROVIDER_SITE_OTHER): Payer: BLUE CROSS/BLUE SHIELD

## 2019-10-08 ENCOUNTER — Ambulatory Visit
Admission: EM | Admit: 2019-10-08 | Discharge: 2019-10-08 | Disposition: A | Discharge status: Home / Self Care | Payer: BLUE CROSS/BLUE SHIELD

## 2019-10-08 ENCOUNTER — Other Ambulatory Visit: Payer: Self-pay

## 2019-10-08 ENCOUNTER — Encounter: Payer: Self-pay | Admitting: Emergency Medicine

## 2019-10-08 DIAGNOSIS — S9031XA Contusion of right foot, initial encounter: Secondary | ICD-10-CM

## 2019-10-08 DIAGNOSIS — M25571 Pain in right ankle and joints of right foot: Secondary | ICD-10-CM

## 2019-10-08 MED ORDER — KETOROLAC TROMETHAMINE 10 MG PO TABS: 10.0000 mg | ORAL_TABLET | Freq: Four times a day (QID) | ORAL | 0 refills | Status: AC | PRN

## 2019-10-08 MED ORDER — KETOROLAC TROMETHAMINE 60 MG/2ML IM SOLN
60.0000 mg | Freq: Once | INTRAMUSCULAR | Status: AC
  Administered 2019-10-08: 60 mg via INTRAMUSCULAR

## 2019-10-08 NOTE — Discharge Instructions (Signed)
Keep your right foot elevated as much as possible.  Take Toradol every 6 hours as needed for pain.  Apply ice for 20 minutes at a time, 2-3 times a day.  Wear a supportive shoe with a firm sole to provide comfort and stability.

## 2019-10-08 NOTE — ED Triage Notes (Signed)
Pt c/o right foot pain and swelling. She states she dropped a pole on her foot this morning.

## 2019-10-08 NOTE — ED Provider Notes (Signed)
[facility]    CSN: [id number] Arrival date & time: 10/08/19  0931      History   Chief Complaint Chief Complaint  Patient presents with   Foot Pain    right    HPI Amber [name] is a 49 y.o. female.   49 yo female here for evaluation of right foot injury.  She reports that she was trying to anchor a flag in the ground when the pole slipped and struck her right foot from shoulder height. She is able to bear weight with pain.   Denies numbness or tingling. She ahs taken 2 Tylenol without relief.      Past Medical History:  Diagnosis Date   Carpal tunnel syndrome of left wrist    Seasonal allergies    TMJ disease     Patient Active Problem List   Diagnosis Date Noted   Obesity (BMI 30.0-34.9) 12/16/2018   Bacterial vaginosis 12/16/2018   Bilateral carpal tunnel syndrome 07/07/2016   Cellulitis 03/08/2016    Past Surgical History:  Procedure Laterality Date   CARPAL TUNNEL RELEASE Left 04/24/2017   Procedure: LEFT CARPAL TUNNEL RELEASE;  Surgeon: Cindee Salt, MD;  Location: [facility];  Service: Orthopedics;  Laterality: Left;  FAB   cecerian section     x 2   CESAREAN SECTION      OB History   No obstetric history on file.      Home Medications    Prior to Admission medications   Medication Sig Start Date End Date Taking? Authorizing Provider  amphetamine-dextroamphetamine (ADDERALL) 20 MG tablet  06/06/16  Yes [provider]  esomeprazole (NEXIUM) 20 MG capsule Take 20 mg by mouth daily at 12 noon.   Yes [provider]  sucralfate (CARAFATE) 1 g tablet Take by mouth. 03/05/19  Yes [provider]  Calcium Carb-Cholecalciferol (CALCIUM CARBONATE-VITAMIN D3 PO) Take by mouth.    [provider]  cyclobenzaprine (FLEXERIL) 5 MG tablet Take 1 tablet (5 mg total) by mouth 3 (three) times daily as needed for muscle spasms. 01/07/18   Loleta Rose, MD  HYDROcodone-acetaminophen  (NORCO/VICODIN) 5-325 MG tablet Take 1 tablet by mouth every 8 (eight) hours as needed for moderate pain. 10/11/17   [name], DO  ketorolac (TORADOL) 10 MG tablet Take 1 tablet (10 mg total) by mouth every 6 (six) hours as needed. 10/08/19   Becky Augusta, NP  predniSONE (DELTASONE) 20 MG tablet Take 3 tablets (60 mg total) by mouth daily. 01/07/18   Loleta Rose, MD  valACYclovir (VALTREX) 1000 MG tablet Take 1 tablet (1,000 mg total) by mouth 3 (three) times daily. 10/11/17   [name], DO    Family History Family History  Problem Relation Age of Onset   Hypertension Other    Dementia Mother     Social History Social History   Tobacco Use   Smoking status: Never Smoker   Smokeless tobacco: Never Used  Building services engineer Use: Never used  Substance Use Topics   Alcohol use: No   Drug use: No     Allergies   Diflucan [fluconazole] and Keflex [cephalexin]   Review of Systems Review of Systems  Constitutional: Negative for activity change, appetite change and fever.  HENT: Negative for congestion and sore throat.   Respiratory: Negative for cough and shortness of breath.   Gastrointestinal: Negative for nausea and vomiting.  Genitourinary: Negative for difficulty urinating and dysuria.  Musculoskeletal: Positive  for joint swelling and myalgias. Negative for arthralgias.  Skin: Negative.   Neurological: Negative for weakness and numbness.  Hematological: Negative.      Physical Exam Triage Vital Signs ED Triage Vitals  Enc Vitals Group     BP 10/08/19 1013 (!) 123/95     Pulse Rate 10/08/19 1013 67     Resp 10/08/19 1013 18     Temp 10/08/19 1013 98.8 F (37.1 C)     Temp Source 10/08/19 1013 Oral     SpO2 10/08/19 1013 99 %     Weight 10/08/19 1010 186 lb 1.1 oz (84.4 kg)     Height 10/08/19 1010 5\' 5"  (1.651 m)     Head Circumference --      Peak Flow --      Pain Score 10/08/19 1010 7     Pain Loc --      Pain Edu? --      Excl. in GC? --      No data found.  Updated Vital Signs BP (!) 123/95 (BP Location: Left Arm)    Pulse 67    Temp 98.8 F (37.1 C) (Oral)    Resp 18    Ht 5\' 5"  (1.651 m)    Wt 186 lb 1.1 oz (84.4 kg)    LMP 10/05/2019    SpO2 99%    BMI 30.96 kg/m   Visual Acuity Right Eye Distance:   Left Eye Distance:   Bilateral Distance:    Right Eye Near:   Left Eye Near:    Bilateral Near:     Physical Exam Vitals and nursing note reviewed.  Constitutional:      General: She is not in acute distress.    Appearance: Normal appearance. She is normal weight.  HENT:     Head: Normocephalic and atraumatic.  Cardiovascular:     Rate and Rhythm: Normal rate and regular rhythm.     Pulses: Normal pulses.     Heart sounds: Normal heart sounds.  Pulmonary:     Effort: Pulmonary effort is normal.     Breath sounds: Normal breath sounds.  Musculoskeletal:        General: Swelling and signs of injury present. No deformity. Normal range of motion.     Cervical back: Normal range of motion and neck supple.     Comments: There is bruising and tenderness to the middle of the 3rd&4th metatarsals. Mild edema present as well. Patient has full active ROM.  Skin:    General: Skin is warm and dry.     Capillary Refill: Capillary refill takes less than 2 seconds.     Findings: Bruising present.  Neurological:     General: No focal deficit present.     Mental Status: She is alert and oriented to person, place, and time.  Psychiatric:        Mood and Affect: Mood normal.        Behavior: Behavior normal.        Thought Content: Thought content normal.        Judgment: Judgment normal.      UC Treatments / Results  Labs (all labs ordered are listed, but only abnormal results are displayed) Labs Reviewed - No data to display  EKG   Radiology DG Foot Complete Right  Result Date: 10/08/2019 CLINICAL DATA:  Pain and bruising, injury EXAM: RIGHT FOOT COMPLETE - 3+ VIEW COMPARISON:  None. FINDINGS: Normal  alignment without acute osseous finding or  fracture. No subluxation or dislocation. No joint abnormality. Slight dorsal foot soft tissue swelling on the lateral view. Small plantar calcaneal spur. IMPRESSION: Minor dorsal foot soft tissue swelling. No acute osseous finding. Electronically Signed   By: Judie Petit.  Shick M.D.   On: 10/08/2019 12:00    Procedures Procedures (including critical care time)  Medications Ordered in UC Medications  ketorolac (TORADOL) injection 60 mg (60 mg Intramuscular Given 10/08/19 1131)    Initial Impression / Assessment and Plan / UC Course  I have reviewed the triage vital signs and the nursing notes.  Pertinent labs & imaging results that were available during my care of the patient were reviewed by me and considered in my medical decision making (see chart for details).    Patient dropped a metal pole on her foot while trying to place a flag for COVID testing in the ground this morning. She is able to bear weight but is having significant pain in he midfoot. There is minimal swelling and ecchymosis. Patient has full ROM and no changes in sensation.  Will obtain imaging as tenderness is disproportionate to injury and exam.  X-ray ned=gative for fracture.  Will D/C home with ice, elevation, and NSAID's.    Final Clinical Impressions(s) / UC Diagnoses   Final diagnoses:  Contusion of right foot, initial encounter     Discharge Instructions     Keep your right foot elevated as much as possible.  Take Toradol every 6 hours as needed for pain.  Apply ice for 20 minutes at a time, 2-3 times a day.  Wear a supportive shoe with a firm sole to provide comfort and stability.     ED Prescriptions    Medication Sig Dispense Auth. Provider   ketorolac (TORADOL) 10 MG tablet Take 1 tablet (10 mg total) by mouth every 6 (six) hours as needed. 20 tablet Becky Augusta, NP     PDMP not reviewed this encounter.   Becky Augusta, NP 10/08/19 [phone]

## 2020-10-28 IMAGING — CR DG FOOT COMPLETE 3+V*R*
3 series · 3 of 3 positions shown · non-contrast
Comparison: None.

CLINICAL DATA: Pain and bruising, injury

EXAM:
RIGHT FOOT COMPLETE - 3+ VIEW

[foot ap]
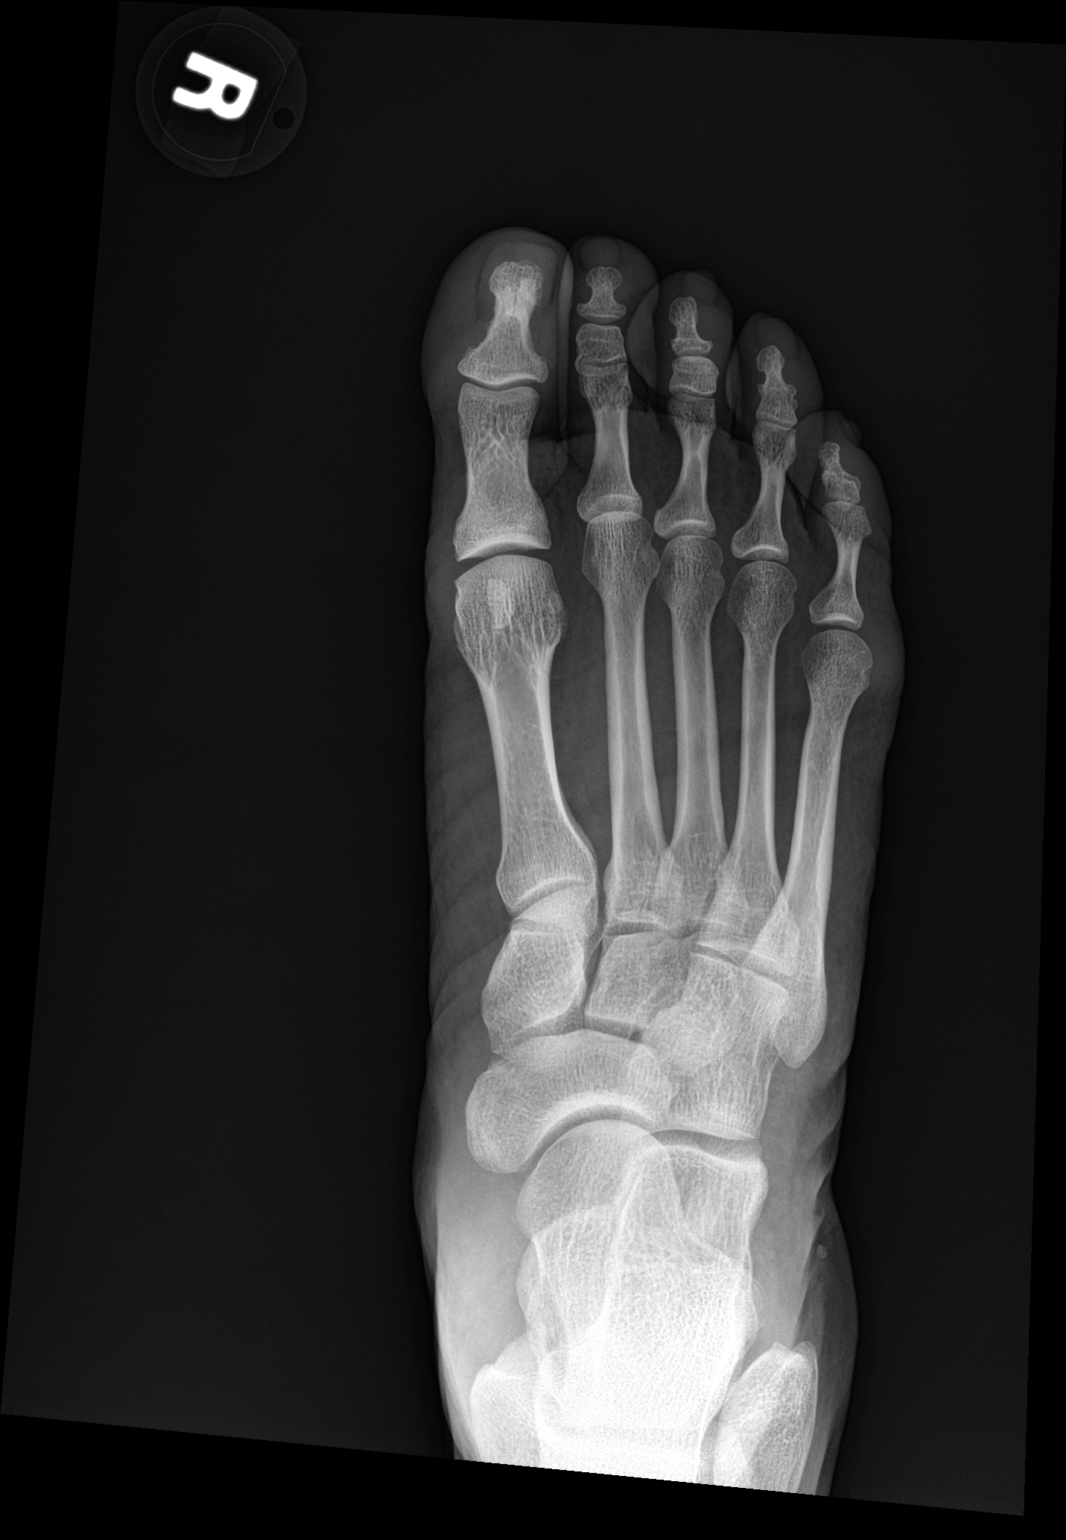

[foot obl]
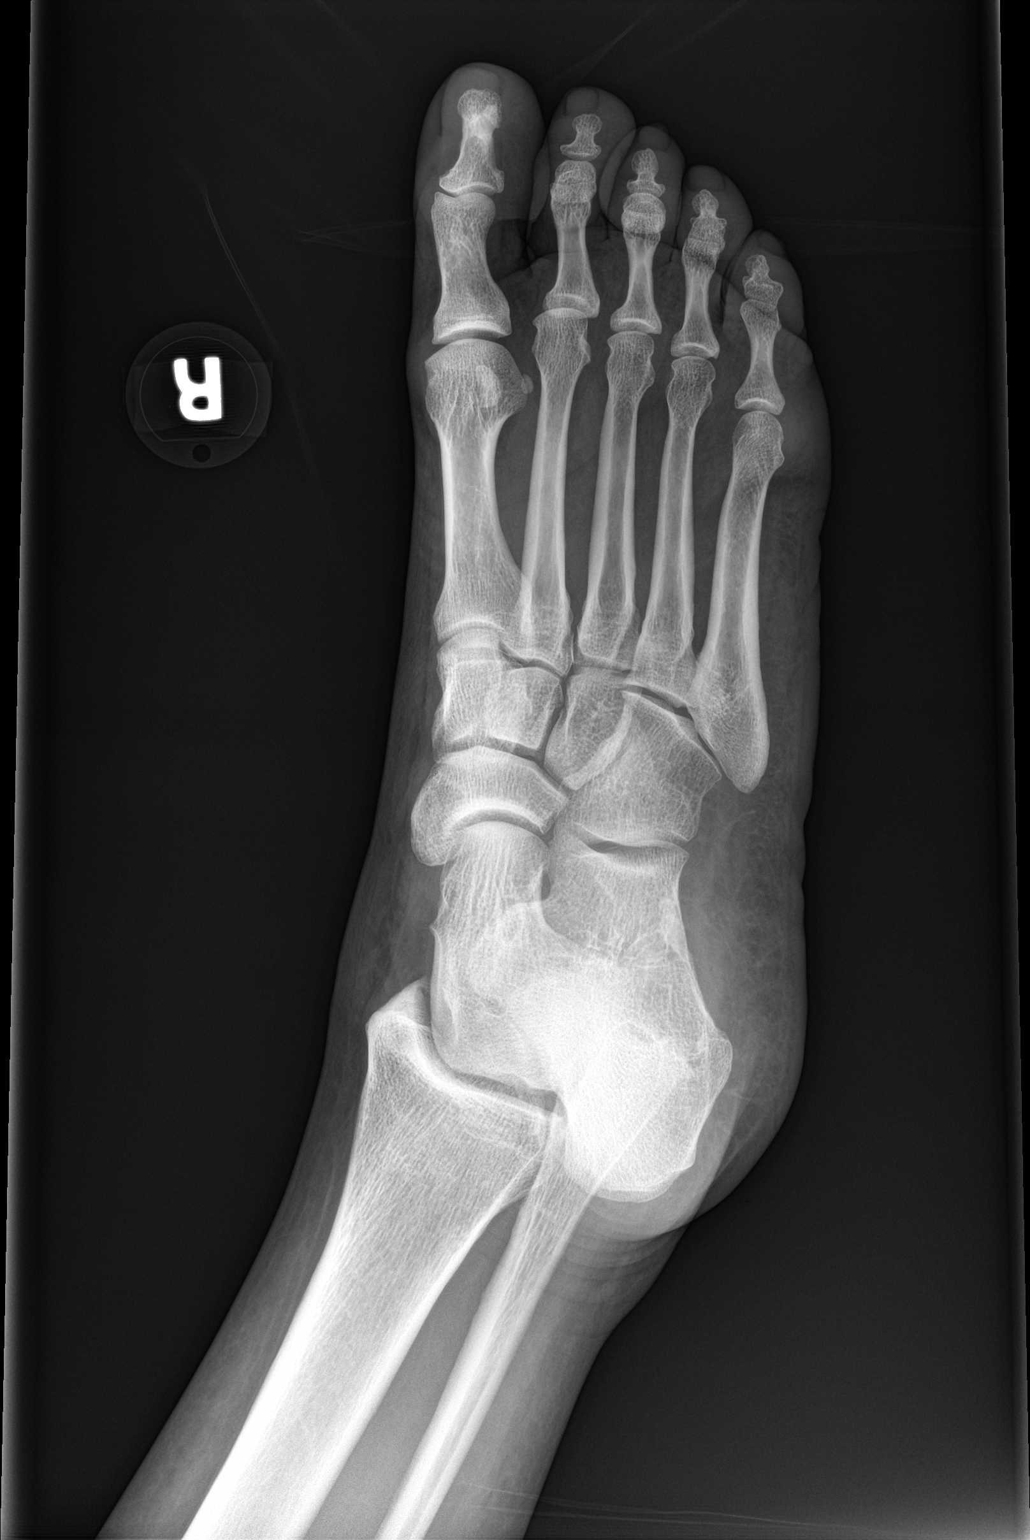

[foot lat]
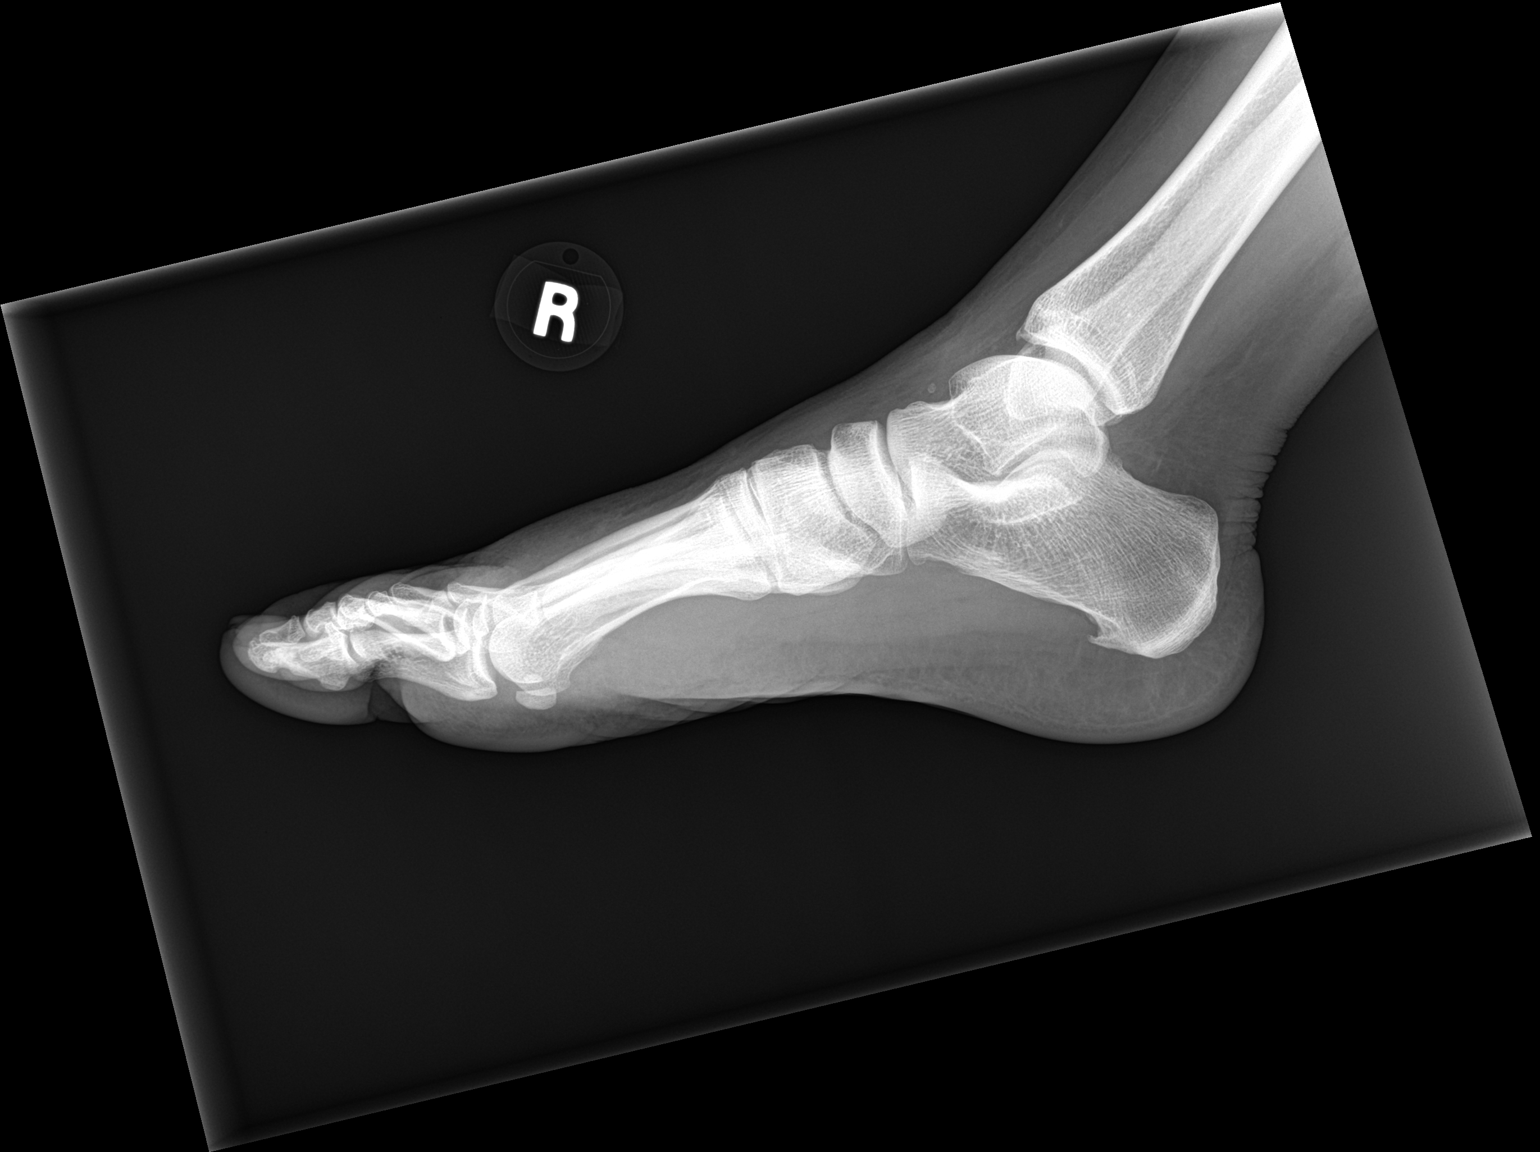

[3 of 3 positions shown; findings below may reference images not displayed]

FINDINGS: Normal alignment without acute osseous finding or fracture. No
subluxation or dislocation. No joint abnormality. Slight dorsal foot
soft tissue swelling on the lateral view. Small plantar calcaneal
spur.
IMPRESSION: Minor dorsal foot soft tissue swelling.

No acute osseous finding.

## 2021-10-21 ENCOUNTER — Other Ambulatory Visit: Payer: Self-pay | Admitting: Obstetrics and Gynecology

## 2021-10-21 DIAGNOSIS — R102 Pelvic and perineal pain: Secondary | ICD-10-CM

## 2021-11-02 ENCOUNTER — Ambulatory Visit
Admission: RE | Admit: 2021-11-02 | Discharge: 2021-11-02 | Disposition: A | Discharge status: Home / Self Care | Payer: PRIVATE HEALTH INSURANCE | Source: Ambulatory Visit | Attending: Obstetrics and Gynecology | Admitting: Obstetrics and Gynecology

## 2021-11-02 DIAGNOSIS — R102 Pelvic and perineal pain: Secondary | ICD-10-CM

## 2023-04-02 ENCOUNTER — Emergency Department

## 2023-04-02 ENCOUNTER — Emergency Department
Admission: EM | Admit: 2023-04-02 | Discharge: 2023-04-02 | Disposition: A | Discharge status: Home / Self Care | Attending: Emergency Medicine | Admitting: Emergency Medicine

## 2023-04-02 ENCOUNTER — Encounter: Payer: Self-pay | Admitting: Emergency Medicine

## 2023-04-02 ENCOUNTER — Other Ambulatory Visit: Payer: Self-pay

## 2023-04-02 DIAGNOSIS — M79605 Pain in left leg: Secondary | ICD-10-CM | POA: Diagnosis not present

## 2023-04-02 DIAGNOSIS — R102 Pelvic and perineal pain: Secondary | ICD-10-CM | POA: Insufficient documentation

## 2023-04-02 MED ORDER — KETOROLAC TROMETHAMINE 30 MG/ML IJ SOLN
60.0000 mg | Freq: Once | INTRAMUSCULAR | Status: AC
Start: 2023-04-02 — End: 2023-04-02
  Administered 2023-04-02: 60 mg via INTRAMUSCULAR
  Filled 2023-04-02: qty 2

## 2023-04-02 MED ORDER — OXYCODONE-ACETAMINOPHEN 7.5-325 MG PO TABS: 1.0000 | ORAL_TABLET | ORAL | 0 refills | Status: AC | PRN

## 2023-04-02 NOTE — ED Triage Notes (Signed)
 Patient to ED via POV for left sided pelvic pain- has pelvic sling that caused a DVT previously. Now having left leg pain. Swelling noted since Friday.

## 2023-04-02 NOTE — ED Provider Notes (Signed)
 [facility] Provider Note    Event Date/Time   First MD Initiated Contact with Patient 04/02/23 1948     (approximate)   History   Pelvic Pain and Leg Pain   HPI  Amber [name] is a 53 y.o. female with history of left leg pain.  Patient had bladder surgery in September, after 5 weeks they found hematoma often centimeters in left inguinal area.  Patient is now being seen at [facility] she has a scheduled MRI Thursday, 27 March.  Follow-up with urology and gynecology      Physical Exam   Triage Vital Signs: ED Triage Vitals  Encounter Vitals Group     BP 04/02/23 1319 (!) 140/86     Systolic BP Percentile --      Diastolic BP Percentile --      Pulse Rate 04/02/23 1319 82     Resp 04/02/23 1319 16     Temp 04/02/23 1319 98.6 F (37 C)     Temp Source 04/02/23 1319 Oral     SpO2 04/02/23 1319 100 %     Weight 04/02/23 1319 200 lb (90.7 kg)     Height 04/02/23 1319 5\' 6"  (1.676 m)     Head Circumference --      Peak Flow --      Pain Score 04/02/23 1321 5     Pain Loc --      Pain Education --      Exclude from Growth Chart --     Most recent vital signs: Vitals:   04/02/23 1744 04/02/23 1924  BP: 129/87 132/80  Pulse: 69 65  Resp: 18 20  Temp: 98.4 F (36.9 C) 98.5 F (36.9 C)  SpO2: 100% 100%     Constitutional: Alert, NAD. Able to speak in complete sentences without cough or dyspnea  Eyes: Conjunctivae are normal.  Head: Atraumatic. Nose: No congestion/rhinnorhea. Mouth/Throat: Mucous membranes are moist.   Neck: Painless ROM. Supple. No JVD, nodes, thyromegaly  Cardiovascular:   Good peripheral circulation.RRR no murmurs, gallops, rubs  Respiratory: Normal respiratory effort.  No retractions. Clear to auscultation bilaterally without wheezing or crackles  Gastrointestinal: Soft and nontender.  Musculoskeletal:  no deformity.  Left leg: Tender to palpation in the lateral lower leg area of the tight.  Left leg rise  negative neurologic:  MAE spontaneously. No gross focal neurologic deficits are appreciated.  Skin:  Skin is warm, dry and intact. No rash noted. Psychiatric: Mood and affect are normal. Speech and behavior are normal.    ED Results / Procedures / Treatments   Labs (all labs ordered are listed, but only abnormal results are displayed) Labs Reviewed - No data to display   EKG     RADIOLOGY I independently reviewed and interpreted imaging and agree with radiologists findings.      PROCEDURES:  Critical Care performed:   Procedures   MEDICATIONS ORDERED IN ED: Medications  ketorolac (TORADOL) 30 MG/ML injection 60 mg (has no administration in time range)   Clinical Course as of 04/02/23 2017  Mon Apr 02, 2023  1942 US Venous Img Lower Unilateral Left No evidence of femoropopliteal DVT or superficial thrombophlebitis within the LEFT lower extremity.   [AE]    Clinical Course User Index [AE] Gladys Damme, PA-C    IMPRESSION / MDM / ASSESSMENT AND PLAN / ED COURSE  I reviewed the triage vital signs and the nursing notes.  Differential diagnosis includes, but is not limited to,  DVT, mass effect, fracture, dislocation  Patient's presentation is most consistent with acute complicated illness / injury requiring diagnostic workup.   Patient's diagnosis is consistent with left leg pain. I independently reviewed and interpreted imaging and agree with radiologists findings ruling out DVT. I did review the patient's allergies and medications.The patient is in stable and satisfactory condition for discharge home  Patient will be discharged home with prescriptions for Percocet. Patient is to follow up with urology and gynecology at [facility] as needed or otherwise directed. Patient is given ED precautions to return to the ED for any worsening or new symptoms. Discussed plan of care with patient, answered all of patient's questions, Patient agreeable to plan of care. Advised  patient to take medications according to the instructions on the label. Discussed possible side effects of new medications. Patient verbalized understanding.    FINAL CLINICAL IMPRESSION(S) / ED DIAGNOSES   Final diagnoses:  Pelvic pain in female  Left leg pain     Rx / DC Orders   ED Discharge Orders          Ordered    oxyCODONE-acetaminophen (PERCOCET) 7.5-325 MG tablet  Every 4 hours PRN        04/02/23 2016             Note:  This document was prepared using Dragon voice recognition software and may include unintentional dictation errors.   Gladys Damme, PA-C 04/02/23 2017    Jene Every, MD 04/02/23 2211

## 2023-04-02 NOTE — Discharge Instructions (Signed)
 Have been diagnosed with pelvic pain, left leg pain.  Please go to your MRI on Thursday.  Please take Percocet 1 tablet by mouth every 6 hours as needed for pain.  Please come back to ED or go to your PCP if you have new symptoms symptoms worsen

## 2023-05-15 ENCOUNTER — Emergency Department
Admission: EM | Admit: 2023-05-15 | Discharge: 2023-05-16 | Disposition: A | Discharge status: Home / Self Care | Attending: Emergency Medicine | Admitting: Emergency Medicine

## 2023-05-15 ENCOUNTER — Emergency Department

## 2023-05-15 ENCOUNTER — Other Ambulatory Visit: Payer: Self-pay

## 2023-05-15 DIAGNOSIS — R638 Other symptoms and signs concerning food and fluid intake: Secondary | ICD-10-CM | POA: Insufficient documentation

## 2023-05-15 DIAGNOSIS — R1084 Generalized abdominal pain: Secondary | ICD-10-CM | POA: Diagnosis not present

## 2023-05-15 DIAGNOSIS — R110 Nausea: Secondary | ICD-10-CM | POA: Diagnosis not present

## 2023-05-15 DIAGNOSIS — R109 Unspecified abdominal pain: Secondary | ICD-10-CM | POA: Diagnosis present

## 2023-05-15 LAB — URINALYSIS, ROUTINE W REFLEX MICROSCOPIC
Bilirubin Urine: NEGATIVE
Glucose, UA: NEGATIVE mg/dL
Hgb urine dipstick: NEGATIVE
Ketones, ur: NEGATIVE mg/dL
Leukocytes,Ua: NEGATIVE
Nitrite: NEGATIVE
Protein, ur: NEGATIVE mg/dL
Specific Gravity, Urine: 1.013 (ref 1.005–1.030)
pH: 5 (ref 5.0–8.0)

## 2023-05-15 LAB — COMPREHENSIVE METABOLIC PANEL WITH GFR
ALT: 17 U/L (ref 0–44)
AST: 17 U/L (ref 15–41)
Albumin: 4.3 g/dL (ref 3.5–5.0)
Alkaline Phosphatase: 58 U/L (ref 38–126)
Anion gap: 9 (ref 5–15)
BUN: 9 mg/dL (ref 6–20)
CO2: 26 mmol/L (ref 22–32)
Calcium: 9.5 mg/dL (ref 8.9–10.3)
Chloride: 105 mmol/L (ref 98–111)
Creatinine, Ser: 0.88 mg/dL (ref 0.44–1.00)
GFR, Estimated: >60 mL/min (ref >60)
Glucose, Bld: 91 mg/dL (ref 70–99)
Potassium: 3.8 mmol/L (ref 3.5–5.1)
Sodium: 140 mmol/L (ref 135–145)
Total Bilirubin: 1.4 mg/dL — ABNORMAL HIGH (ref 0.0–1.2)
Total Protein: 7.3 g/dL (ref 6.5–8.1)

## 2023-05-15 LAB — CBC
HCT: 44.3 % (ref 36.0–46.0)
Hemoglobin: 14.7 g/dL (ref 12.0–15.0)
MCH: 31.2 pg (ref 26.0–34.0)
MCHC: 33.2 g/dL (ref 30.0–36.0)
MCV: 94.1 fL (ref 80.0–100.0)
Platelets: 405 10*3/uL — ABNORMAL HIGH (ref 150–400)
RBC: 4.71 MIL/uL (ref 3.87–5.11)
RDW: 12.5 % (ref 11.5–15.5)
WBC: 8.7 10*3/uL (ref 4.0–10.5)
nRBC: 0 % (ref 0.0–0.2)

## 2023-05-15 LAB — LIPASE, BLOOD: Lipase: 27 U/L (ref 11–51)

## 2023-05-15 LAB — POC URINE PREG, ED: Preg Test, Ur: NEGATIVE

## 2023-05-15 MED ORDER — MORPHINE SULFATE (PF) 4 MG/ML IV SOLN
4.0000 mg | Freq: Once | INTRAVENOUS | Status: AC
  Administered 2023-05-15: 4 mg via INTRAVENOUS
  Filled 2023-05-15: qty 1

## 2023-05-15 MED ORDER — ONDANSETRON HCL 4 MG/2ML IJ SOLN
4.0000 mg | Freq: Once | INTRAMUSCULAR | Status: AC
  Administered 2023-05-15: 4 mg via INTRAVENOUS
  Filled 2023-05-15: qty 2

## 2023-05-15 MED ORDER — IOHEXOL 300 MG/ML  SOLN
100.0000 mL | Freq: Once | INTRAMUSCULAR | Status: AC | PRN
  Administered 2023-05-15: 100 mL via INTRAVENOUS

## 2023-05-15 MED ORDER — LACTATED RINGERS IV BOLUS
1000.0000 mL | Freq: Once | INTRAVENOUS | Status: AC
Start: 2023-05-15 — End: 2023-05-16
  Administered 2023-05-15: 1000 mL via INTRAVENOUS

## 2023-05-15 NOTE — ED Notes (Signed)
 ED Provider at bedside.

## 2023-05-15 NOTE — ED Triage Notes (Signed)
 Pt presents to the ED POV from home with pelvic pain, abdominal pain, nausea, lightheaded, and urinary frequency. Pt states that she had a bladder sling placed in Sept for stress incontinence and has had issues ever since.

## 2023-05-15 NOTE — ED Provider Notes (Signed)
 [facility] Provider Note    Event Date/Time   First MD Initiated Contact with Patient 05/15/23 2304     (approximate)   History   Chief Complaint Abdominal Pain   HPI  Amber [name] is a 53 y.o. female with past medical history of DVT, GERD, and bladder sling procedure who presents to the ED complaining of abdominal pain.  Patient reports that she deals with chronic pain in her bilateral pelvic areas, however she has had increasing pain moving up into her abdomen over the past 24 hours.  She describes this as different than her usual pain, associated with some nausea and decreased oral intake.  She denies any vomiting and has not had any changes in her bowel movements, dysuria, hematuria, vaginal bleeding, or discharge.  She does report some urinary frequency but has not had any fevers or flank pain.  She has been taking her usual Lyrica as well as Tylenol  without significant relief in pain.     Physical Exam   Triage Vital Signs: ED Triage Vitals  Encounter Vitals Group     BP 05/15/23 1814 126/85     Systolic BP Percentile --      Diastolic BP Percentile --      Pulse Rate 05/15/23 1814 61     Resp 05/15/23 1814 18     Temp 05/15/23 1814 98.2 F (36.8 C)     Temp Source 05/15/23 1814 Oral     SpO2 05/15/23 1814 100 %     Weight 05/15/23 1816 199 lb 15.3 oz (90.7 kg)     Height 05/15/23 1816 5\' 6"  (1.676 m)     Head Circumference --      Peak Flow --      Pain Score 05/15/23 1814 8     Pain Loc --      Pain Education --      Exclude from Growth Chart --     Most recent vital signs: Vitals:   05/15/23 1814 05/15/23 2221  BP: 126/85 132/85  Pulse: 61 (!) 52  Resp: 18 16  Temp: 98.2 F (36.8 C) 97.9 F (36.6 C)  SpO2: 100% 100%    Constitutional: Alert and oriented. Eyes: Conjunctivae are normal. Head: Atraumatic. Nose: No congestion/rhinnorhea. Mouth/Throat: Mucous membranes are moist.  Cardiovascular: Normal rate, regular  rhythm. Grossly normal heart sounds.  2+ radial pulses bilaterally. Respiratory: Normal respiratory effort.  No retractions. Lungs CTAB. Gastrointestinal: Soft and diffusely tender to palpation with no rebound or guarding.  No CVA tenderness bilaterally. No distention. Musculoskeletal: No lower extremity tenderness nor edema.  Neurologic:  Normal speech and language. No gross focal neurologic deficits are appreciated.    ED Results / Procedures / Treatments   Labs (all labs ordered are listed, but only abnormal results are displayed) Labs Reviewed  COMPREHENSIVE METABOLIC PANEL WITH GFR - Abnormal; Notable for the following components:      Result Value   Total Bilirubin 1.4 (*)    All other components within normal limits  CBC - Abnormal; Notable for the following components:   Platelets 405 (*)    All other components within normal limits  URINALYSIS, ROUTINE W REFLEX MICROSCOPIC - Abnormal; Notable for the following components:   Color, Urine YELLOW (*)    APPearance CLEAR (*)    All other components within normal limits  LIPASE, BLOOD  POC URINE PREG, ED     EKG  ED ECG REPORT I, Twilla Galea, the  attending physician, personally viewed and interpreted this ECG.   Date: 05/15/2023  EKG Time: 18:23  Rate: 57  Rhythm: sinus bradycardia  Axis: Normal  Intervals:none  ST&T Change: None  RADIOLOGY CT abdomen/pelvis reviewed and interpreted by me with no inflammatory changes, focal fluid collections, or dilated bowel loops.  PROCEDURES:  Critical Care performed: No  Procedures   MEDICATIONS ORDERED IN ED: Medications  morphine  (PF) 4 MG/ML injection 4 mg (4 mg Intravenous Given 05/15/23 2354)  ondansetron  (ZOFRAN ) injection 4 mg (4 mg Intravenous Given 05/15/23 2354)  iohexol (OMNIPAQUE) 300 MG/ML solution 100 mL (100 mLs Intravenous Contrast Given 05/15/23 2338)  lactated ringers  bolus 1,000 mL (1,000 mLs Intravenous New Bag/Given 05/15/23 2353)     IMPRESSION /  MDM / ASSESSMENT AND PLAN / ED COURSE  I reviewed the triage vital signs and the nursing notes.                              53 y.o. female with past medical history of DVT and GERD who presents to the ED complaining of 24 hours of increasing abdominal pain and nausea.  Patient's presentation is most consistent with acute presentation with potential threat to life or bodily function.  Differential diagnosis includes, but is not limited to, appendicitis, diverticulitis, colitis, kidney stone, UTI, bowel obstruction, ovarian cyst, chronic pain.  Patient nontoxic-appearing and in no acute distress, vital signs remarkable for mild bradycardia but otherwise reassuring.  Patient's abdomen is soft but she does have diffuse tenderness on palpation, describes pain as different from her chronic pelvic pain.  Labs are reassuring with no significant anemia, leukocytosis, lecture abnormality, or AKI.  LFTs and lipase are unremarkable, urinalysis with no signs of infection.  We will treat symptomatically with IV morphine  and Zofran , hydrate with IV fluids and further assess with CT imaging.  CT abdomen/pelvis is negative for acute process, does show persistent soft tissue mass lesion in the left pelvic area, favored to be hematoma following sling procedure and stable compared to MRI last month.  Patient feeling better on reassessment, tolerating oral intake without difficulty, and is appropriate for discharge home with outpatient follow-up.  She was counseled to return to the ED for new or worsening symptoms, patient agrees with plan.      FINAL CLINICAL IMPRESSION(S) / ED DIAGNOSES   Final diagnoses:  Generalized abdominal pain     Rx / DC Orders   ED Discharge Orders          Ordered    ondansetron  (ZOFRAN -ODT) 4 MG disintegrating tablet  Every 8 hours PRN        05/16/23 0028             Note:  This document was prepared using Dragon voice recognition software and may include  unintentional dictation errors.   Twilla Galea, MD 05/16/23 [phone]

## 2023-05-16 MED ORDER — ONDANSETRON 4 MG PO TBDP: 4.0000 mg | ORAL_TABLET | Freq: Three times a day (TID) | ORAL | 0 refills | Status: AC | PRN

## 2023-11-02 ENCOUNTER — Other Ambulatory Visit: Payer: Self-pay | Admitting: Family Medicine

## 2023-11-02 DIAGNOSIS — M5416 Radiculopathy, lumbar region: Secondary | ICD-10-CM

## 2023-11-03 ENCOUNTER — Ambulatory Visit
Admission: RE | Admit: 2023-11-03 | Discharge: 2023-11-03 | Disposition: A | Discharge status: Home / Self Care | Payer: PRIVATE HEALTH INSURANCE | Source: Ambulatory Visit | Attending: Family Medicine | Admitting: Family Medicine

## 2023-11-03 DIAGNOSIS — M5416 Radiculopathy, lumbar region: Secondary | ICD-10-CM

## 2024-02-04 ENCOUNTER — Emergency Department

## 2024-02-04 ENCOUNTER — Other Ambulatory Visit: Payer: Self-pay

## 2024-02-04 ENCOUNTER — Emergency Department: Admission: EM | Admit: 2024-02-04 | Discharge: 2024-02-04 | Disposition: A | Discharge status: Home / Self Care

## 2024-02-04 DIAGNOSIS — M25562 Pain in left knee: Secondary | ICD-10-CM | POA: Insufficient documentation

## 2024-02-04 DIAGNOSIS — M25552 Pain in left hip: Secondary | ICD-10-CM | POA: Insufficient documentation

## 2024-02-04 DIAGNOSIS — M25512 Pain in left shoulder: Secondary | ICD-10-CM | POA: Insufficient documentation

## 2024-02-04 DIAGNOSIS — W19XXXA Unspecified fall, initial encounter: Secondary | ICD-10-CM

## 2024-02-04 DIAGNOSIS — M25572 Pain in left ankle and joints of left foot: Secondary | ICD-10-CM | POA: Insufficient documentation

## 2024-02-04 DIAGNOSIS — W010XXA Fall on same level from slipping, tripping and stumbling without subsequent striking against object, initial encounter: Secondary | ICD-10-CM | POA: Diagnosis not present

## 2024-02-04 MED ORDER — KETOROLAC TROMETHAMINE 30 MG/ML IJ SOLN
30.0000 mg | Freq: Once | INTRAMUSCULAR | Status: AC
  Administered 2024-02-04: 30 mg via INTRAMUSCULAR
  Filled 2024-02-04: qty 1

## 2024-02-04 NOTE — ED Provider Notes (Signed)
 "   [facility] Emergency Department Provider Note     Event Date/Time   First MD Initiated Contact with Patient 02/04/24 1547     (approximate)   History   Fall   HPI  Amber [name] is a 54 y.o. female with no significant past medical history presents to the ED for evaluation of a mechanical fall yesterday.  Patient reports she was at a gas station and slipped and fell on the ice landing on her left hip.  Patient remains ambulatory but reports pain has increased.  She reports initial pain in her left knee left ankle and left shoulder that has improved since the fall.  Denies head injury and LOC     Physical Exam   Triage Vital Signs: ED Triage Vitals  Encounter Vitals Group     BP 02/04/24 1516 132/87     Girls Systolic BP Percentile --      Girls Diastolic BP Percentile --      Boys Systolic BP Percentile --      Boys Diastolic BP Percentile --      Pulse Rate 02/04/24 1516 98     Resp 02/04/24 1516 17     Temp 02/04/24 1516 98.9 F (37.2 C)     Temp Source 02/04/24 1516 Oral     SpO2 02/04/24 1516 98 %     Weight 02/04/24 1526 217 lb (98.4 kg)     Height 02/04/24 1526 5' 6 (1.676 m)     Head Circumference --      Peak Flow --      Pain Score 02/04/24 1525 7     Pain Loc --      Pain Education --      Exclude from Growth Chart --     Most recent vital signs: Vitals:   02/04/24 1516  BP: 132/87  Pulse: 98  Resp: 17  Temp: 98.9 F (37.2 C)  SpO2: 98%    General Awake, no distress.  HEENT NCAT. PERRL. EOMI. No rhinorrhea. Mucous membranes are moist.  CV:  Good peripheral perfusion.  RESP:  Normal effort.  ABD:  No distention. Soft nontender Other:  Left hip reveals no visible deformity.  There is full range of motion in the left hip.    ED Results / Procedures / Treatments   Labs (all labs ordered are listed, but only abnormal results are displayed) Labs Reviewed - No data to display  RADIOLOGY  I personally viewed  and evaluated these images as part of my medical decision making, as well as reviewing the written report by the radiologist.  ED Provider Interpretation: No acute bony abnormality noted to left hip  DG Hip Unilat W or Wo Pelvis 2-3 Views Left Result Date: 02/04/2024 EXAM: 2 or 3 VIEW(S) XRAY OF THE PELVIS AND LEFT HIP 02/04/2024 04:23:00 PM COMPARISON: None available. CLINICAL HISTORY: Patient fell. Slip and fall injury yesterday. Left hip pain. FINDINGS: BONES AND JOINTS: SI joints are symmetric. No acute fracture. Bilateral hips demonstrate normal alignment. SOFT TISSUES: Unremarkable. IMPRESSION: 1. No acute fracture or dislocation of the left hip. Electronically signed by: Elsie Gravely MD 02/04/2024 04:54 PM EST RP Workstation: [id number]    PROCEDURES:  Critical Care performed: No  Procedures   MEDICATIONS ORDERED IN ED: Medications  ketorolac  (TORADOL ) 30 MG/ML injection 30 mg (30 mg Intramuscular Given 02/04/24 1607)     IMPRESSION / MDM / ASSESSMENT AND PLAN / ED COURSE  I reviewed the  triage vital signs and the nursing notes.                              Clinical Course as of 02/04/24 RUDEAN Kitchens Feb 04, 2024  1727 DG Hip Unilat W or Wo Pelvis 2-3 Views Left IMPRESSION: 1. No acute fracture or dislocation of the left hip.   [MH]    Clinical Course User Index [MH] Margrette Rebbeca LABOR, PA-C    54 y.o. female presents to the emergency department for evaluation and treatment of mechanical fall sustaining left hip pain. See HPI for further details.   Differential diagnosis includes, but is not limited to fracture, dislocation, strain  Patient's presentation is most consistent with acute complicated illness / injury requiring diagnostic workup.  Patient is alert and oriented.  She is hemodynamic stable.  Physical exam findings are stated above and there is overall benign.  Patient is ambulatory with the absence of an antalgic gait.  This is reassuring.  Left hip x-ray was  obtained and is unremarkable.  Patient was administered IM Toradol  for pain and on reassessment reported some improvement.  I do believe she is in stable condition for discharge home.  Will send prescription for Flexeril .  Advised alternation between Tylenol  and ibuprofen  for pain as needed and limit physical activity for a few days.  Patient verbalized understanding.  Patient stable condition for discharge home.  ED return precautions were discussed.  FINAL CLINICAL IMPRESSION(S) / ED DIAGNOSES   Final diagnoses:  Fall, initial encounter  Left hip pain   Rx / DC Orders   ED Discharge Orders     None      Note:  This document was prepared using Dragon voice recognition software and may include unintentional dictation errors.    Margrette, Karmela Bram A, PA-C 02/04/24 1906    Clarine Ozell LABOR, MD 02/04/24 2023  "

## 2024-02-04 NOTE — Discharge Instructions (Addendum)
 You were evaluated in the ED for a fall and left hip pain.  Your x-ray reveals no acute fracture or dislocation of the left hip.  Please limit physical activity.  Be safe in the snow.  Follow-up with your primary care provider as needed.

## 2024-02-04 NOTE — ED Triage Notes (Signed)
 Pt comes in via pov after slipping and falling yesterday at the gas station . Pt complains of left hip, left knee pain, left ankle pain, and left shoulder pain. Pt states that her hip hurts the worst. Pt complains of hip pain 7/10 at this time.
